# Patient Record
Sex: Female | Born: 1945 | Race: White | Hispanic: No
Health system: Southern US, Community
[De-identification: ages and names within clinical notes are randomized; demographics above are authoritative.]

## PROBLEM LIST (undated history)

## (undated) DIAGNOSIS — G629 Polyneuropathy, unspecified: Secondary | ICD-10-CM

## (undated) DIAGNOSIS — M810 Age-related osteoporosis without current pathological fracture: Secondary | ICD-10-CM

## (undated) DIAGNOSIS — E785 Hyperlipidemia, unspecified: Secondary | ICD-10-CM

## (undated) DIAGNOSIS — I219 Acute myocardial infarction, unspecified: Secondary | ICD-10-CM

## (undated) DIAGNOSIS — F329 Major depressive disorder, single episode, unspecified: Secondary | ICD-10-CM

## (undated) DIAGNOSIS — I10 Essential (primary) hypertension: Secondary | ICD-10-CM

## (undated) DIAGNOSIS — R627 Adult failure to thrive: Secondary | ICD-10-CM

## (undated) DIAGNOSIS — E875 Hyperkalemia: Secondary | ICD-10-CM

## (undated) DIAGNOSIS — N390 Urinary tract infection, site not specified: Secondary | ICD-10-CM

## (undated) DIAGNOSIS — F03918 Unspecified dementia, unspecified severity, with other behavioral disturbance: Secondary | ICD-10-CM

## (undated) DIAGNOSIS — I639 Cerebral infarction, unspecified: Secondary | ICD-10-CM

## (undated) DIAGNOSIS — I509 Heart failure, unspecified: Secondary | ICD-10-CM

## (undated) DIAGNOSIS — R269 Unspecified abnormalities of gait and mobility: Secondary | ICD-10-CM

## (undated) DIAGNOSIS — N189 Chronic kidney disease, unspecified: Secondary | ICD-10-CM

## (undated) DIAGNOSIS — E119 Type 2 diabetes mellitus without complications: Secondary | ICD-10-CM

## (undated) DIAGNOSIS — I2510 Atherosclerotic heart disease of native coronary artery without angina pectoris: Secondary | ICD-10-CM

## (undated) DIAGNOSIS — K219 Gastro-esophageal reflux disease without esophagitis: Secondary | ICD-10-CM

## (undated) DIAGNOSIS — F0390 Unspecified dementia without behavioral disturbance: Secondary | ICD-10-CM

## (undated) DIAGNOSIS — F32A Depression, unspecified: Secondary | ICD-10-CM

## (undated) DIAGNOSIS — F0391 Unspecified dementia with behavioral disturbance: Secondary | ICD-10-CM

## (undated) HISTORY — DX: Atherosclerotic heart disease of native coronary artery without angina pectoris: I25.10

## (undated) HISTORY — DX: Unspecified dementia, unspecified severity, with other behavioral disturbance: F03.918

## (undated) HISTORY — DX: Age-related osteoporosis without current pathological fracture: M81.0

## (undated) HISTORY — DX: Hyperkalemia: E87.5

## (undated) HISTORY — DX: Cerebral infarction, unspecified: I63.9

## (undated) HISTORY — DX: Type 2 diabetes mellitus without complications: E11.9

## (undated) HISTORY — DX: Chronic kidney disease, unspecified: N18.9

## (undated) HISTORY — DX: Essential (primary) hypertension: I10

## (undated) HISTORY — PX: CORONARY ARTERY BYPASS GRAFT: SHX141

## (undated) HISTORY — DX: Hyperlipidemia, unspecified: E78.5

## (undated) HISTORY — DX: Unspecified abnormalities of gait and mobility: R26.9

## (undated) HISTORY — DX: Gastro-esophageal reflux disease without esophagitis: K21.9

## (undated) HISTORY — DX: Polyneuropathy, unspecified: G62.9

## (undated) HISTORY — DX: Major depressive disorder, single episode, unspecified: F32.9

## (undated) HISTORY — DX: Depression, unspecified: F32.A

## (undated) HISTORY — DX: Unspecified dementia, unspecified severity, without behavioral disturbance, psychotic disturbance, mood disturbance, and anxiety: F03.90

## (undated) HISTORY — DX: Unspecified dementia with behavioral disturbance: F03.91

## (undated) HISTORY — DX: Urinary tract infection, site not specified: N39.0

## (undated) HISTORY — PX: TONSILLECTOMY: SUR1361

## (undated) HISTORY — DX: Adult failure to thrive: R62.7

## (undated) HISTORY — DX: Heart failure, unspecified: I50.9

## (undated) HISTORY — DX: Acute myocardial infarction, unspecified: I21.9

---

## 2003-09-20 ENCOUNTER — Other Ambulatory Visit: Payer: Self-pay

## 2004-04-29 ENCOUNTER — Emergency Department: Payer: Self-pay | Admitting: Emergency Medicine

## 2004-09-01 ENCOUNTER — Ambulatory Visit: Payer: Self-pay

## 2004-09-17 ENCOUNTER — Ambulatory Visit: Payer: Self-pay

## 2005-05-19 ENCOUNTER — Ambulatory Visit: Payer: Self-pay | Admitting: Family Medicine

## 2005-07-07 ENCOUNTER — Emergency Department: Payer: Self-pay | Admitting: Emergency Medicine

## 2005-07-08 ENCOUNTER — Emergency Department: Payer: Self-pay | Admitting: Emergency Medicine

## 2005-10-12 ENCOUNTER — Ambulatory Visit: Payer: Self-pay | Admitting: Family Medicine

## 2006-02-24 ENCOUNTER — Ambulatory Visit: Payer: Self-pay | Admitting: Gastroenterology

## 2006-03-23 ENCOUNTER — Ambulatory Visit: Payer: Self-pay | Admitting: Gastroenterology

## 2006-05-11 ENCOUNTER — Other Ambulatory Visit: Payer: Self-pay

## 2006-05-11 ENCOUNTER — Emergency Department: Payer: Self-pay

## 2006-11-29 ENCOUNTER — Ambulatory Visit: Payer: Self-pay | Admitting: Family Medicine

## 2007-01-09 ENCOUNTER — Ambulatory Visit: Payer: Self-pay | Admitting: Nephrology

## 2007-02-27 ENCOUNTER — Emergency Department: Payer: Self-pay | Admitting: Emergency Medicine

## 2007-07-17 ENCOUNTER — Ambulatory Visit: Payer: Self-pay | Admitting: Family Medicine

## 2007-08-08 ENCOUNTER — Emergency Department: Payer: Self-pay | Admitting: Emergency Medicine

## 2007-08-28 ENCOUNTER — Ambulatory Visit: Payer: Self-pay | Admitting: Internal Medicine

## 2007-09-01 ENCOUNTER — Inpatient Hospital Stay: Payer: Self-pay | Admitting: Internal Medicine

## 2007-09-02 ENCOUNTER — Other Ambulatory Visit: Payer: Self-pay

## 2007-09-03 ENCOUNTER — Other Ambulatory Visit: Payer: Self-pay

## 2007-09-04 ENCOUNTER — Ambulatory Visit: Payer: Self-pay | Admitting: Internal Medicine

## 2007-09-27 ENCOUNTER — Ambulatory Visit: Payer: Self-pay | Admitting: Internal Medicine

## 2007-10-28 ENCOUNTER — Ambulatory Visit: Payer: Self-pay | Admitting: Internal Medicine

## 2007-11-28 ENCOUNTER — Ambulatory Visit: Payer: Self-pay | Admitting: Internal Medicine

## 2008-01-08 ENCOUNTER — Ambulatory Visit: Payer: Self-pay | Admitting: Internal Medicine

## 2008-01-17 ENCOUNTER — Inpatient Hospital Stay: Payer: Self-pay | Admitting: Internal Medicine

## 2008-01-28 ENCOUNTER — Ambulatory Visit: Payer: Self-pay | Admitting: Internal Medicine

## 2008-02-27 ENCOUNTER — Ambulatory Visit: Payer: Self-pay | Admitting: Internal Medicine

## 2008-05-06 ENCOUNTER — Ambulatory Visit: Payer: Self-pay | Admitting: Internal Medicine

## 2008-10-07 ENCOUNTER — Inpatient Hospital Stay: Payer: Self-pay | Admitting: Internal Medicine

## 2008-12-11 ENCOUNTER — Ambulatory Visit: Payer: Self-pay | Admitting: Family Medicine

## 2009-07-09 ENCOUNTER — Emergency Department: Payer: Self-pay | Admitting: Emergency Medicine

## 2009-08-15 ENCOUNTER — Emergency Department: Payer: Self-pay | Admitting: Internal Medicine

## 2009-09-24 ENCOUNTER — Inpatient Hospital Stay: Payer: Self-pay | Admitting: Internal Medicine

## 2009-10-08 ENCOUNTER — Ambulatory Visit: Payer: Self-pay | Admitting: Ophthalmology

## 2009-10-11 ENCOUNTER — Inpatient Hospital Stay: Payer: Self-pay | Admitting: Internal Medicine

## 2009-11-14 ENCOUNTER — Emergency Department: Payer: Self-pay | Admitting: Emergency Medicine

## 2009-12-16 ENCOUNTER — Inpatient Hospital Stay: Payer: Self-pay | Admitting: *Deleted

## 2010-03-12 ENCOUNTER — Ambulatory Visit: Payer: Self-pay | Admitting: Ophthalmology

## 2010-03-16 ENCOUNTER — Emergency Department: Payer: Self-pay | Admitting: Emergency Medicine

## 2010-06-07 ENCOUNTER — Emergency Department: Payer: Self-pay | Admitting: Emergency Medicine

## 2010-09-22 ENCOUNTER — Emergency Department: Payer: Self-pay | Admitting: Emergency Medicine

## 2011-02-16 ENCOUNTER — Inpatient Hospital Stay: Payer: Self-pay | Admitting: Internal Medicine

## 2011-02-24 ENCOUNTER — Emergency Department: Payer: Self-pay

## 2011-03-20 ENCOUNTER — Emergency Department: Payer: Self-pay | Admitting: *Deleted

## 2011-03-23 ENCOUNTER — Emergency Department: Payer: Self-pay | Admitting: Emergency Medicine

## 2011-03-29 ENCOUNTER — Ambulatory Visit: Payer: Self-pay | Admitting: Ophthalmology

## 2011-08-09 ENCOUNTER — Emergency Department: Payer: Self-pay | Admitting: Emergency Medicine

## 2011-08-09 LAB — COMPREHENSIVE METABOLIC PANEL
Albumin: 2.9 g/dL — ABNORMAL LOW (ref 3.4–5.0)
Anion Gap: 9 (ref 7–16)
Bilirubin,Total: 0.2 mg/dL (ref 0.2–1.0)
Chloride: 104 mmol/L (ref 98–107)
Creatinine: 1.22 mg/dL (ref 0.60–1.30)
EGFR (African American): 53 — ABNORMAL LOW
EGFR (Non-African Amer.): 46 — ABNORMAL LOW
Glucose: 470 mg/dL — ABNORMAL HIGH (ref 65–99)
Osmolality: 298 (ref 275–301)
SGOT(AST): 14 U/L — ABNORMAL LOW (ref 15–37)
SGPT (ALT): 15 U/L
Sodium: 138 mmol/L (ref 136–145)
Total Protein: 6.7 g/dL (ref 6.4–8.2)

## 2011-08-09 LAB — URINALYSIS, COMPLETE
Glucose,UR: >=500 mg/dL (ref 0–75)
Protein: 100
RBC,UR: 1 /HPF (ref 0–5)
Squamous Epithelial: 1
WBC UR: 10 /HPF (ref 0–5)

## 2011-08-09 LAB — CBC
HCT: 39.8 % (ref 35.0–47.0)
MCH: 30.1 pg (ref 26.0–34.0)
MCHC: 33.4 g/dL (ref 32.0–36.0)

## 2011-08-09 LAB — TROPONIN I: Troponin-I: <0.02 ng/mL

## 2011-08-12 LAB — URINE CULTURE

## 2011-09-17 ENCOUNTER — Emergency Department: Payer: Self-pay | Admitting: *Deleted

## 2011-12-14 IMAGING — CT CT MAXILLOFACIAL WITHOUT CONTRAST
2 series · 16 of 40 positions shown, 20 images · non-contrast
Comparison: none

REASON FOR EXAM: facial trauma  pain
COMMENTS:

[Series 2: facial 3.0 h60f · axial · 0.34mm/px · z∈[-360,-225]mm · 13 of 53 slices shown, 17 images]
[im 4/53  brain]
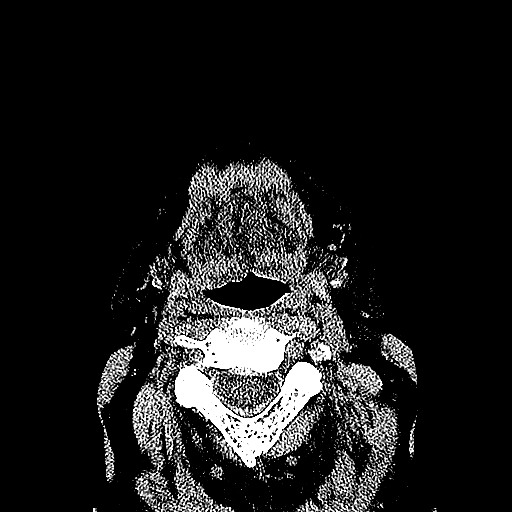
[im 4/53  bone]
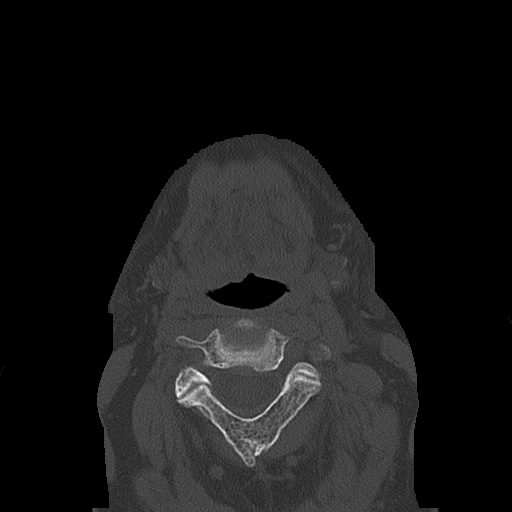
[im 8/53  bone]
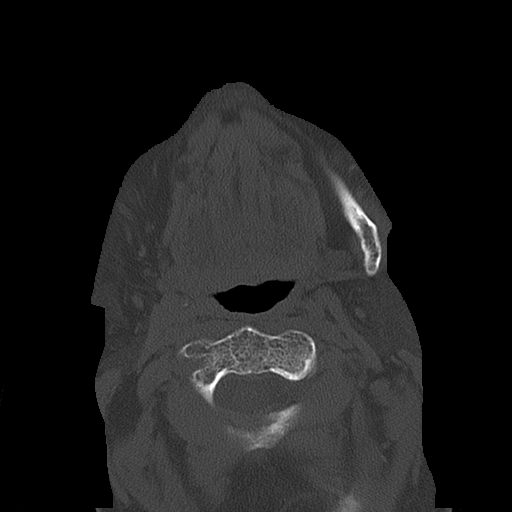
[im 11/53  bone]
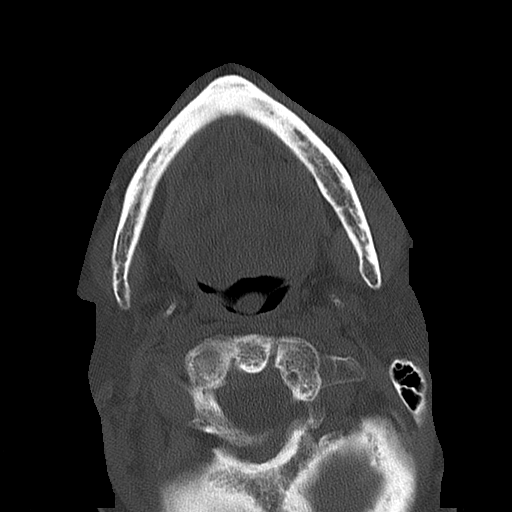
[im 15/53  bone]
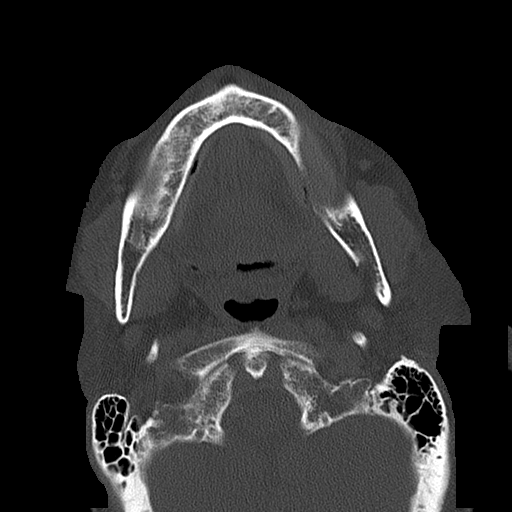
[im 18/53  brain]
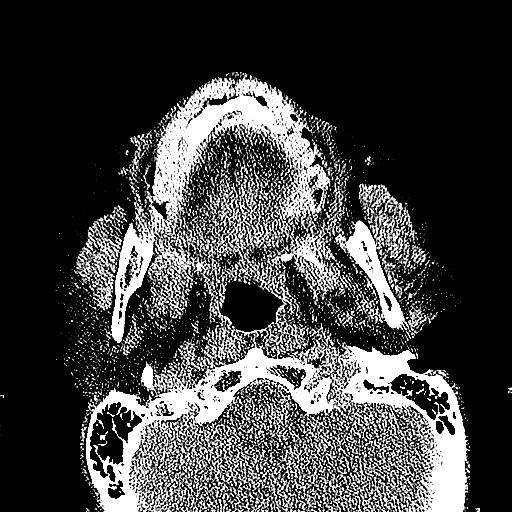
[im 18/53  bone]
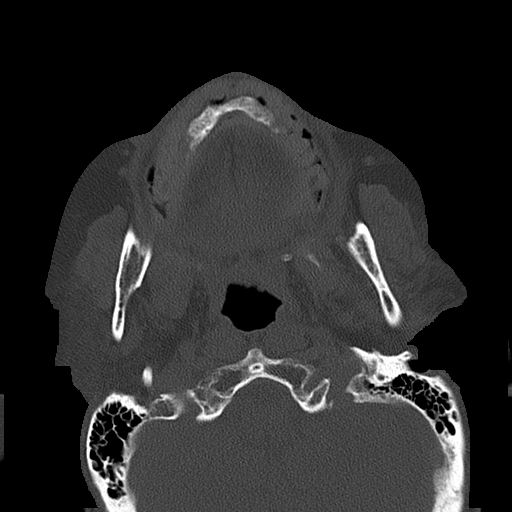
[im 22/53  bone]
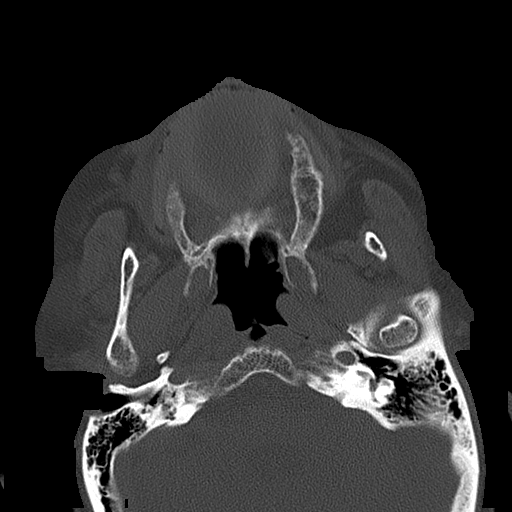
[im 27/53  bone]
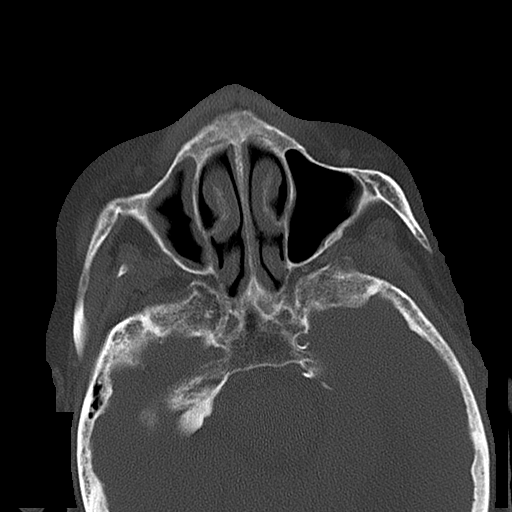
[im 31/53  bone]
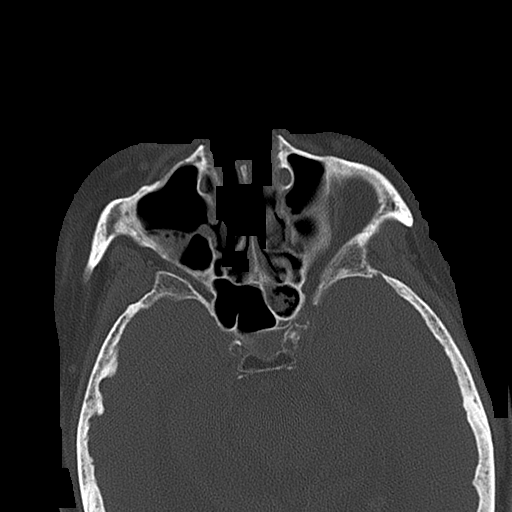
[im 35/53  brain]
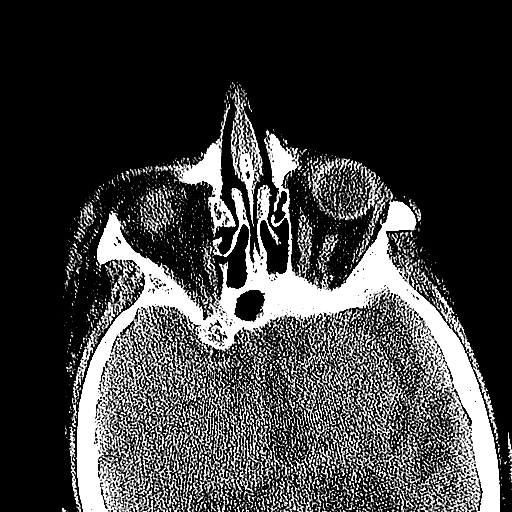
[im 35/53  bone]
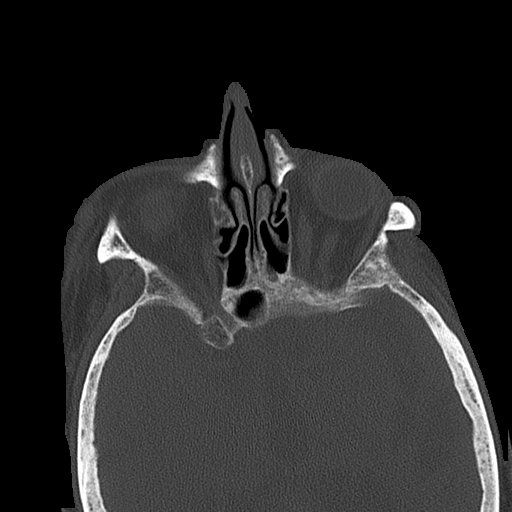
[im 38/53  bone]
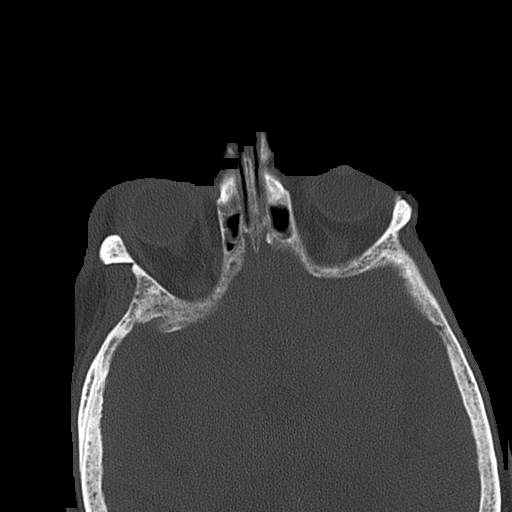
[im 42/53  bone]
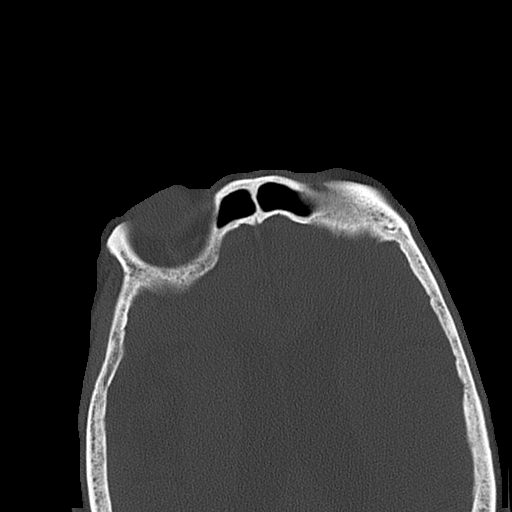
[im 45/53  bone]
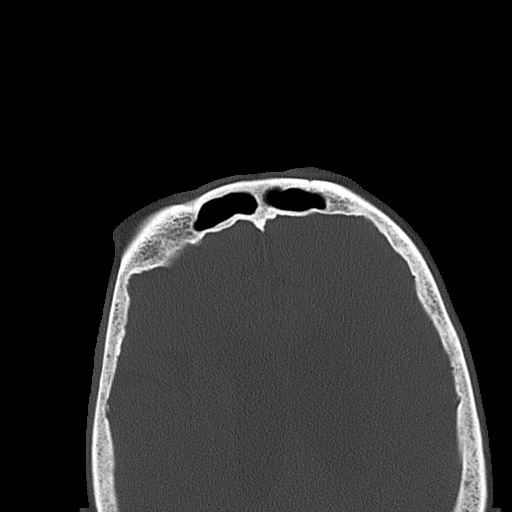
[im 49/53  brain]
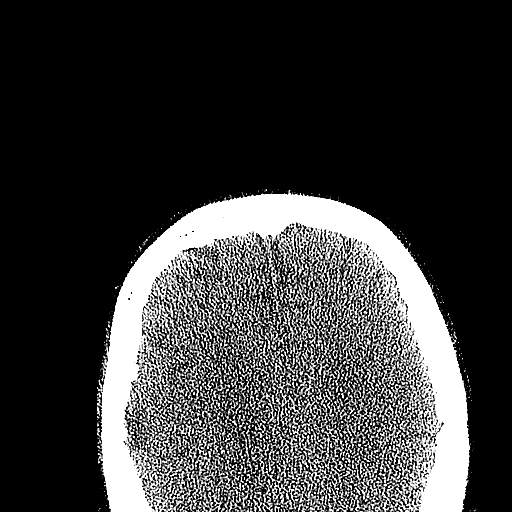
[im 49/53  bone]
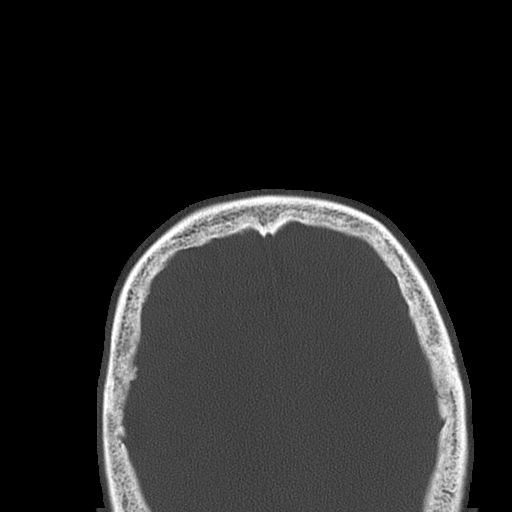

[Series 3: facial 3.0 spo · coronal · 0.32mm/px · 3 of 51 slices shown]
[im 17/51  bone]
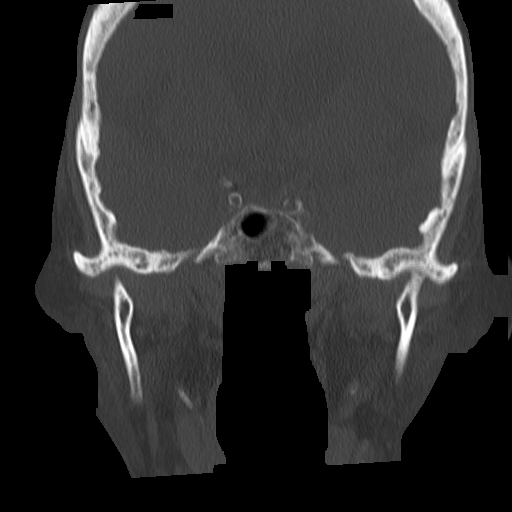
[im 23/51  bone]
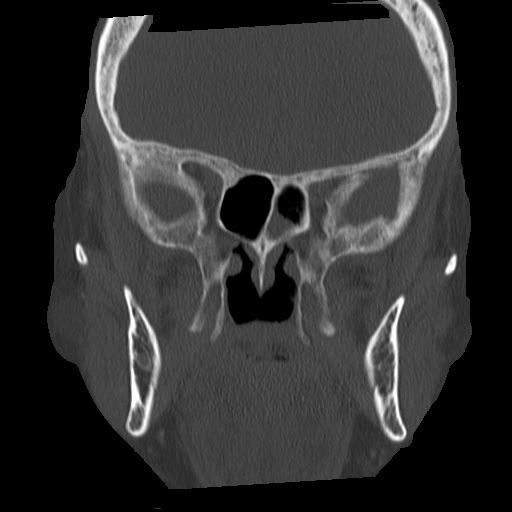
[im 28/51  bone]
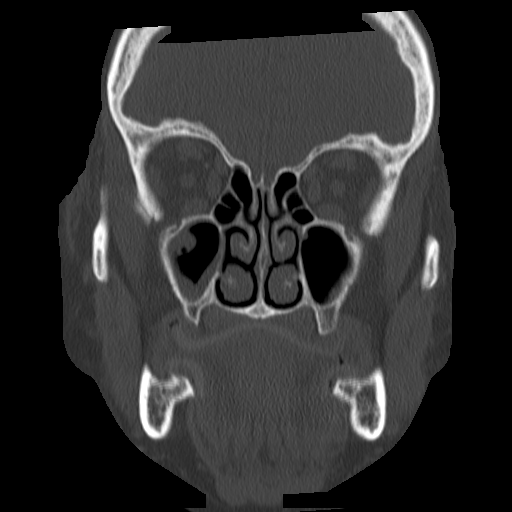

[16 of 40 positions shown; findings below may reference images not displayed]

PROCEDURE:     CT  - CT MAXILLOFACIAL AREA WO  - June 07, 2010  [DATE]

RESULT:     Axial imaging was performed through the facial bones using a
bone algorithm. Images were reconstructed in the axial plane at 3 mm
intervals and slice thicknesses. Coronal reconstructions were obtained as
well.

There is mucoperiosteal thickening in the sphenoid as well as both maxillary
sinuses. The frontal and ethmoid sinuses are clear. The nasal septum is
intact. The nasal bones also appear intact. The zygomatic arches exhibit no
evidence of acute fracture. The coronal images reveal the bony orbits to be
intact. There is mild age-related narrowing of the temporomandibular joints.
IMPRESSION: 1. I do not see evidence of an acute facial bone fracture.
2. There is a small amount of mucoperiosteal thickening within the maxillary
and sphenoid sinuses.

## 2011-12-20 ENCOUNTER — Inpatient Hospital Stay: Payer: Self-pay | Admitting: Internal Medicine

## 2011-12-20 LAB — URINALYSIS, COMPLETE
Bilirubin,UR: NEGATIVE
Glucose,UR: >=500 mg/dL (ref 0–75)
Nitrite: NEGATIVE
RBC,UR: 17 /HPF (ref 0–5)
WBC UR: 881 /HPF (ref 0–5)

## 2011-12-20 LAB — COMPREHENSIVE METABOLIC PANEL
Albumin: 3.5 g/dL (ref 3.4–5.0)
Alkaline Phosphatase: 126 U/L (ref 50–136)
BUN: 30 mg/dL — ABNORMAL HIGH (ref 7–18)
Bilirubin,Total: 0.7 mg/dL (ref 0.2–1.0)
Chloride: 97 mmol/L — ABNORMAL LOW (ref 98–107)
Creatinine: 1.33 mg/dL — ABNORMAL HIGH (ref 0.60–1.30)
EGFR (African American): 47 — ABNORMAL LOW
EGFR (Non-African Amer.): 41 — ABNORMAL LOW
Glucose: 537 mg/dL (ref 65–99)
Osmolality: 299 (ref 275–301)
Potassium: 5.3 mmol/L — ABNORMAL HIGH (ref 3.5–5.1)
SGOT(AST): 22 U/L (ref 15–37)
Sodium: 134 mmol/L — ABNORMAL LOW (ref 136–145)
Total Protein: 7.7 g/dL (ref 6.4–8.2)

## 2011-12-20 LAB — CBC WITH DIFFERENTIAL/PLATELET
Basophil %: 0.4 %
Eosinophil %: 0 %
HCT: 46.9 % (ref 35.0–47.0)
HGB: 16.4 g/dL — ABNORMAL HIGH (ref 12.0–16.0)
Lymphocyte #: 0.6 10*3/uL — ABNORMAL LOW (ref 1.0–3.6)
Lymphocyte %: 3.7 %
MCH: 30.6 pg (ref 26.0–34.0)
MCHC: 34.9 g/dL (ref 32.0–36.0)
MCV: 88 fL (ref 80–100)
Monocyte #: 0.8 x10 3/mm (ref 0.2–0.9)
Monocyte %: 4.7 %
Neutrophil #: 15.4 10*3/uL — ABNORMAL HIGH (ref 1.4–6.5)
Neutrophil %: 91.2 %

## 2011-12-20 LAB — MAGNESIUM: Magnesium: 2 mg/dL

## 2011-12-20 LAB — LIPASE, BLOOD: Lipase: 167 U/L (ref 73–393)

## 2011-12-21 LAB — CBC WITH DIFFERENTIAL/PLATELET
Basophil #: 0.2 10*3/uL — ABNORMAL HIGH (ref 0.0–0.1)
Eosinophil #: 0.1 10*3/uL (ref 0.0–0.7)
Eosinophil %: 0.5 %
Lymphocyte #: 1.7 10*3/uL (ref 1.0–3.6)
Lymphocyte %: 9.8 %
MCH: 29.1 pg (ref 26.0–34.0)
MCV: 86 fL (ref 80–100)
Monocyte #: 1.4 x10 3/mm — ABNORMAL HIGH (ref 0.2–0.9)
Platelet: 207 10*3/uL (ref 150–440)
WBC: 17.4 10*3/uL — ABNORMAL HIGH (ref 3.6–11.0)

## 2011-12-21 LAB — BASIC METABOLIC PANEL
Anion Gap: 9 (ref 7–16)
BUN: 29 mg/dL — ABNORMAL HIGH (ref 7–18)
Co2: 25 mmol/L (ref 21–32)
Creatinine: 1.09 mg/dL (ref 0.60–1.30)
EGFR (African American): >60
EGFR (Non-African Amer.): 52 — ABNORMAL LOW
Potassium: 4 mmol/L (ref 3.5–5.1)
Sodium: 139 mmol/L (ref 136–145)

## 2011-12-21 LAB — PROTIME-INR: Prothrombin Time: 13.1 secs (ref 11.5–14.7)

## 2011-12-21 LAB — HEMOGLOBIN A1C: Hemoglobin A1C: 11.2 % — ABNORMAL HIGH (ref 4.2–6.3)

## 2011-12-22 LAB — URINE CULTURE

## 2011-12-22 LAB — WBC: WBC: 15.4 10*3/uL — ABNORMAL HIGH (ref 3.6–11.0)

## 2012-02-26 ENCOUNTER — Emergency Department: Payer: Self-pay | Admitting: Emergency Medicine

## 2012-02-26 LAB — CBC WITH DIFFERENTIAL/PLATELET
Basophil %: 0.6 %
Eosinophil %: 1.6 %
HCT: 36.9 % (ref 35.0–47.0)
HGB: 12.4 g/dL (ref 12.0–16.0)
Lymphocyte #: 1.4 10*3/uL (ref 1.0–3.6)
Lymphocyte %: 12.4 %
MCHC: 33.6 g/dL (ref 32.0–36.0)
Monocyte #: 1.1 x10 3/mm — ABNORMAL HIGH (ref 0.2–0.9)
Monocyte %: 9.7 %
Neutrophil #: 8.5 10*3/uL — ABNORMAL HIGH (ref 1.4–6.5)
RBC: 4.29 10*6/uL (ref 3.80–5.20)
WBC: 11.2 10*3/uL — ABNORMAL HIGH (ref 3.6–11.0)

## 2012-02-26 LAB — COMPREHENSIVE METABOLIC PANEL
Albumin: 3.3 g/dL — ABNORMAL LOW (ref 3.4–5.0)
Alkaline Phosphatase: 97 U/L (ref 50–136)
Anion Gap: 6 — ABNORMAL LOW (ref 7–16)
Calcium, Total: 8.7 mg/dL (ref 8.5–10.1)
Co2: 27 mmol/L (ref 21–32)
Creatinine: 1.28 mg/dL (ref 0.60–1.30)
Osmolality: 283 (ref 275–301)
Potassium: 4.5 mmol/L (ref 3.5–5.1)
SGOT(AST): 25 U/L (ref 15–37)
Sodium: 139 mmol/L (ref 136–145)
Total Protein: 7.1 g/dL (ref 6.4–8.2)

## 2012-02-26 LAB — BASIC METABOLIC PANEL
Anion Gap: 8 (ref 7–16)
BUN: 26 mg/dL — ABNORMAL HIGH (ref 7–18)
Chloride: 109 mmol/L — ABNORMAL HIGH (ref 98–107)
Co2: 23 mmol/L (ref 21–32)
Creatinine: 1.2 mg/dL (ref 0.60–1.30)
EGFR (Non-African Amer.): 46 — ABNORMAL LOW
Potassium: 3.8 mmol/L (ref 3.5–5.1)
Sodium: 140 mmol/L (ref 136–145)

## 2012-08-05 ENCOUNTER — Inpatient Hospital Stay: Payer: Self-pay | Admitting: Internal Medicine

## 2012-08-05 LAB — COMPREHENSIVE METABOLIC PANEL
Anion Gap: 4 — ABNORMAL LOW (ref 7–16)
Calcium, Total: 8.5 mg/dL (ref 8.5–10.1)
Chloride: 110 mmol/L — ABNORMAL HIGH (ref 98–107)
Creatinine: 1.25 mg/dL (ref 0.60–1.30)
EGFR (African American): 51 — ABNORMAL LOW
Glucose: 125 mg/dL — ABNORMAL HIGH (ref 65–99)
Potassium: 5.9 mmol/L — ABNORMAL HIGH (ref 3.5–5.1)
SGPT (ALT): 24 U/L (ref 12–78)
Sodium: 140 mmol/L (ref 136–145)

## 2012-08-05 LAB — URINALYSIS, COMPLETE
Bacteria: NONE SEEN
Bilirubin,UR: NEGATIVE
Glucose,UR: NEGATIVE mg/dL (ref 0–75)
Nitrite: NEGATIVE
Ph: 6 (ref 4.5–8.0)
Protein: 100
RBC,UR: <1 /HPF (ref 0–5)
Specific Gravity: 1.008 (ref 1.003–1.030)

## 2012-08-05 LAB — CBC WITH DIFFERENTIAL/PLATELET
Basophil %: 0.8 %
Eosinophil #: 0.4 10*3/uL (ref 0.0–0.7)
Eosinophil %: 4.2 %
Lymphocyte #: 1.3 10*3/uL (ref 1.0–3.6)
Lymphocyte %: 13 %
MCH: 29.2 pg (ref 26.0–34.0)
MCHC: 34.3 g/dL (ref 32.0–36.0)
MCV: 85 fL (ref 80–100)
Monocyte %: 10.5 %
Neutrophil #: 6.9 10*3/uL — ABNORMAL HIGH (ref 1.4–6.5)
Neutrophil %: 71.5 %
Platelet: 152 10*3/uL (ref 150–440)
RBC: 4 10*6/uL (ref 3.80–5.20)
RDW: 14.4 % (ref 11.5–14.5)

## 2012-08-05 LAB — TROPONIN I: Troponin-I: <0.02 ng/mL

## 2012-08-05 LAB — PRO B NATRIURETIC PEPTIDE: B-Type Natriuretic Peptide: 1115 pg/mL — ABNORMAL HIGH (ref 0–125)

## 2012-08-06 DIAGNOSIS — I509 Heart failure, unspecified: Secondary | ICD-10-CM

## 2012-08-06 DIAGNOSIS — I059 Rheumatic mitral valve disease, unspecified: Secondary | ICD-10-CM

## 2012-08-06 LAB — BASIC METABOLIC PANEL
BUN: 21 mg/dL — ABNORMAL HIGH (ref 7–18)
Chloride: 108 mmol/L — ABNORMAL HIGH (ref 98–107)
Creatinine: 1.01 mg/dL (ref 0.60–1.30)
EGFR (African American): >60
EGFR (Non-African Amer.): 57 — ABNORMAL LOW
Glucose: 127 mg/dL — ABNORMAL HIGH (ref 65–99)
Osmolality: 284 (ref 275–301)
Potassium: 4.6 mmol/L (ref 3.5–5.1)
Sodium: 140 mmol/L (ref 136–145)

## 2012-08-06 LAB — LIPID PANEL
Ldl Cholesterol, Calc: 63 mg/dL (ref 0–100)
Triglycerides: 191 mg/dL (ref 0–200)
VLDL Cholesterol, Calc: 38 mg/dL (ref 5–40)

## 2012-08-06 LAB — CBC WITH DIFFERENTIAL/PLATELET
Basophil %: 1.1 %
Eosinophil %: 3.7 %
HGB: 12.5 g/dL (ref 12.0–16.0)
Lymphocyte #: 1.2 10*3/uL (ref 1.0–3.6)
MCHC: 34 g/dL (ref 32.0–36.0)
MCV: 84 fL (ref 80–100)
Monocyte #: 1 x10 3/mm — ABNORMAL HIGH (ref 0.2–0.9)
Neutrophil #: 6.8 10*3/uL — ABNORMAL HIGH (ref 1.4–6.5)
RBC: 4.37 10*6/uL (ref 3.80–5.20)
WBC: 9.3 10*3/uL (ref 3.6–11.0)

## 2012-08-06 LAB — TSH: Thyroid Stimulating Horm: 7.04 u[IU]/mL — ABNORMAL HIGH

## 2012-08-06 LAB — MAGNESIUM: Magnesium: 1.9 mg/dL

## 2012-08-06 LAB — HEMOGLOBIN A1C: Hemoglobin A1C: 6.8 % — ABNORMAL HIGH (ref 4.2–6.3)

## 2012-08-08 ENCOUNTER — Encounter: Payer: Self-pay | Admitting: *Deleted

## 2012-08-08 ENCOUNTER — Telehealth: Payer: Self-pay

## 2012-08-08 NOTE — Telephone Encounter (Signed)
tcm

## 2012-08-08 NOTE — Telephone Encounter (Signed)
Message copied by Marcelle Overlie on Tue Aug 08, 2012  9:10 AM ------      Message from: Kendrick Fries      Created: Tue Aug 08, 2012  9:05 AM       TCM      [facility]      Discharged: 08/08/12      Appoint: 08/15/12 [place]            Records printed. ------

## 2012-08-08 NOTE — Telephone Encounter (Signed)
TCM attempt #1 Non-working # for only # listed

## 2012-08-09 NOTE — Telephone Encounter (Signed)
TCM #2 attempt Non working #

## 2012-08-15 ENCOUNTER — Encounter: Payer: Self-pay | Admitting: Physician Assistant

## 2012-08-15 ENCOUNTER — Ambulatory Visit (INDEPENDENT_AMBULATORY_CARE_PROVIDER_SITE_OTHER): Payer: Medicare Other | Admitting: Physician Assistant

## 2012-08-15 VITALS — BP 154/67 | HR 57 | Ht 66.0 in | Wt 208.5 lb

## 2012-08-15 DIAGNOSIS — I10 Essential (primary) hypertension: Secondary | ICD-10-CM | POA: Insufficient documentation

## 2012-08-15 DIAGNOSIS — I2510 Atherosclerotic heart disease of native coronary artery without angina pectoris: Secondary | ICD-10-CM | POA: Insufficient documentation

## 2012-08-15 DIAGNOSIS — I5032 Chronic diastolic (congestive) heart failure: Secondary | ICD-10-CM | POA: Insufficient documentation

## 2012-08-15 DIAGNOSIS — R7989 Other specified abnormal findings of blood chemistry: Secondary | ICD-10-CM | POA: Insufficient documentation

## 2012-08-15 DIAGNOSIS — R42 Dizziness and giddiness: Secondary | ICD-10-CM

## 2012-08-15 DIAGNOSIS — I25810 Atherosclerosis of coronary artery bypass graft(s) without angina pectoris: Secondary | ICD-10-CM

## 2012-08-15 DIAGNOSIS — Z0189 Encounter for other specified special examinations: Secondary | ICD-10-CM

## 2012-08-15 DIAGNOSIS — I509 Heart failure, unspecified: Secondary | ICD-10-CM

## 2012-08-15 DIAGNOSIS — Z7689 Persons encountering health services in other specified circumstances: Secondary | ICD-10-CM

## 2012-08-15 MED ORDER — HYDRALAZINE HCL 25 MG PO TABS: 25.0000 mg | ORAL_TABLET | Freq: Two times a day (BID) | ORAL | Status: AC

## 2012-08-15 MED ORDER — FUROSEMIDE 20 MG PO TABS: ORAL_TABLET | ORAL | Status: AC

## 2012-08-15 NOTE — Assessment & Plan Note (Addendum)
Denies worsening CHF type symptoms. Euvolemic on exam today with clear lungs. Maintained on low-dose Lasix. Tolerating this dose well. Suspect recent admission was attributed to hypertensive emergency +/- dietary indiscretion. We discussed important CHF management techniques including daily weight and symptom monitoring, salt and fluid restriction, and taking Lasix on an empty stomach to facilitate absorption. Advised to take an additional Lasix PRN for weight gain or shortness of breath.

## 2012-08-15 NOTE — Patient Instructions (Addendum)
Please continue to take Lasix as prescribed. Take an additional tablet for weight increase of 3 lbs in one day, 5 lbs in two days, worsening shortness of breath or leg swelling.   Please monitor your weights daily.  Restrict salt intake to <2 gram/day. Limit fluid intake.   Please continue to monitor your blood pressure, and call for consistently elevated readings (> 150/90).   We will see you back in one month.   If your symptoms or level of activity worsen, please call the office to pursue a stress test.

## 2012-08-15 NOTE — Assessment & Plan Note (Addendum)
BP 154/67 today. Given her DM2, would like to start on ACEi/ARB however she has had hyperkalemia on these agents in the past. Will provide with hydralazine 25mg  PO BID to start. She will monitor her blood pressures and any new symptoms with this change. Stressed sodium restriction as a means of BP management as well. Continue BB, CCB.

## 2012-08-15 NOTE — Progress Notes (Signed)
Patient ID: Adrienne Lee, female   DOB: Jan 27, 1946, 67 y.o.   MRN: [id number]            Date:  08/15/2012   ID:  Adrienne Lee, DOB 05-Jul-1945, MRN [id number]  PCP:  Hyman Hopes, MD / Phineas Real, MD Primary Cardiologist:  Concha Se, MD   History of Present Illness: [name] is a 67 y.o. female with PMHx s/f mild dementia, questionable h/o CAD (s/p CABG at [facility]), chronic diastolic CHF, type 2 DM (insulin-dependent), HTN, GERD, CVA/TIA, peripheral neuropathy and dementia who was admitted to [facility] 5/10 to 08/07/12 for acute on chronic diastolic CHF.   She was admitted c/o worsening shortness of breath. BNP was mildly elevated at 1115. BP 212/64.CXR was without evidence of pulmonary edema. She was seen in consultation by Dr. Excell Seltzer after receiving IV Lasix. She was noted to be euvolemic at that time. She denied any chest pain and formally ruled out. TSH did return elevated at 7.04. A1C 6.8%. EKG revealed no evidence of ischemia. She diuresed further and symptoms improved. She was discharged on Lasix 20mg  daily and the medications listed below. She returns today for post-hospital follow-up. Intake and output and daily weights actually reflect minimal output and mild weight gain, respectively- question if these are accurate.   2D echo 08/06/12 revealed LVEF 55-60%, elevated LVEDP, mild concentric LVH, mild aortic valve sclerosis without stenosis, mild TR.   She returns today for follow-up. She is accompanied by a representative from her ALF. She has felt well since discharge denying chest pain, SOB/DOE, PND, orthopnea, palpitations, lightheadedness or syncope. She is on a moderated diet at the facility, however not a salt restricted diet. She denies increased fluid intake. No fevers, chills, n/v/d or active bleeding. She is ambulates with a walker at baseline and is able to complete her ADLs.   Her history is difficult to obtain due to mild dementia. She has a midline scar and what appears to be  endovascular vein harvest scar on LLE c/w prior CABG. When asking the patient, she denies having this done. There is a mention of prior CABG at [facility] from her recent [facility] admission. She denies any prior MI or chest pain. She does note undergoing prior stress tests, all "normal," but is unable to provide dates.   EKG: NSR, 57 bpm, RBBB, Q waves V4-V6, II (unchanged from 08/05/12 tracing)  Wt Readings from Last 3 Encounters:  08/15/12 208 lb 8 oz (94.575 kg)     Past Medical History  Diagnosis Date  . Hypertension   . Diabetes mellitus without complication   . Dementia   . Hyperlipidemia   . Osteoporosis   . Depression   . Peripheral neuropathy   . Chronic kidney disease   . CVA (cerebral infarction)   . Urinary tract infection     history  . GERD (gastroesophageal reflux disease)   . Coronary artery disease   . Hyperkalemia   . CHF (congestive heart failure)     Current Outpatient Prescriptions  Medication Sig Dispense Refill  . amLODipine (NORVASC) 10 MG tablet Take 10 mg by mouth daily.      Marland Kitchen aspirin 81 MG tablet Take 81 mg by mouth daily.      Marland Kitchen donepezil (ARICEPT) 10 MG tablet Take 10 mg by mouth daily.      . furosemide (LASIX) 20 MG tablet Take 20 mg by mouth daily.      . insulin aspart (NOVOLOG) 100 UNIT/ML injection Inject into  the skin as directed.      . insulin glargine (LANTUS) 100 UNIT/ML injection Inject 25 Units into the skin at bedtime.      . metFORMIN (GLUCOPHAGE) 500 MG tablet Take 500 mg by mouth 2 (two) times daily with a meal.      . metoprolol tartrate (LOPRESSOR) 25 MG tablet Take 25 mg by mouth daily.      . mirtazapine (REMERON) 15 MG tablet Take 0.5 mg by mouth at bedtime.      . pantoprazole (PROTONIX) 40 MG tablet Take 40 mg by mouth daily.      . rosuvastatin (CRESTOR) 10 MG tablet Take 10 mg by mouth daily.      . sertraline (ZOLOFT) 100 MG tablet Take 100 mg by mouth daily.       No current facility-administered medications for this visit.     Allergies:    Allergies  Allergen Reactions  . Ace Inhibitors   . Augmentin (Amoxicillin-Pot Clavulanate)   . Bactrim (Sulfamethoxazole W-Trimethoprim)   . Codeine   . Levaquin (Levofloxacin In D5w)   . Morphine And Related   . Penicillins   . Phenergan (Promethazine Hcl)   . Sulfa Antibiotics     Social History:  The patient  reports that she has quit smoking. She does not have any smokeless tobacco history on file. She reports that she does not drink alcohol or use illicit drugs.   ROS:  Please see the history of present illness. All other systems reviewed and negative.   PHYSICAL EXAM: VS:  BP 154/67  Pulse 57  Ht 5\' 6"  (1.676 m)  Wt 208 lb 8 oz (94.575 kg)  BMI 33.67 kg/m2 Well nourished, well developed elderly female seated in a wheelchair in no acute distress HEENT: normal Neck: no JVD Cardiac:  normal S1, S2; RRR; no murmur Lungs:  clear to auscultation bilaterally, no wheezing, rhonchi or rales Abd: soft, nontender, no hepatomegaly Ext: no edema Musculoskeletal: midline scar on chest noted, LLE linear scar just inferior to L knee noted Skin: warm and dry Neuro:  CNs 2-12 intact, no focal abnormalities noted

## 2012-08-15 NOTE — Assessment & Plan Note (Signed)
On recent hospital admission. This may have been in the setting of CHF decompensation, however have advised to follow-up with PCP for further evaluation.

## 2012-08-15 NOTE — Assessment & Plan Note (Addendum)
She ruled out for MI on recent [facility] admission. She denies any chest pain post-discharge. There seems to be some confusion from the patient about whether she has had prior bypass grafting or not. There is clearly evidence of this on physical exam. EKG does suggest prior infarct. Her mild dementia does cloud the details of her history. She believes this was performed at either [facility] or [facility]. Will plan to check for these records. Discussed option of stress testing to further evaluate. The patient declines this option. Unclear if she would derive significant benefit from revascularization if stress test were to return abnormal. She will call if she develops worsening CHF or new ischemic symptoms. Continue ASA, BB and statin for now.

## 2012-09-01 IMAGING — CT CT ABD-PELV W/O CM
1 of 2 series · 15 of 32 positions shown, 19 images · non-contrast
Comparison: none

REASON FOR EXAM: (1) severe acute onset R flank/RUQ pain (s/p
cholecystectomy yrs ago); (2) see a
COMMENTS:

PROCEDURE:     CT  - CT ABDOMEN AND PELVIS W[DATE]  [DATE]
RESULT:     CT abdomen and pelvis dated 02/24/2011.
TECHNIQUE: Helical noncontrasted 3 mm sections were obtained from the lung
bases through the pubic symphysis.

[Series 2: 3mm soft tissue · axial · 0.81mm/px · z∈[-1014,-574]mm · 15 of 161 slices shown, 19 images]
[im 7/161  soft-tissue]
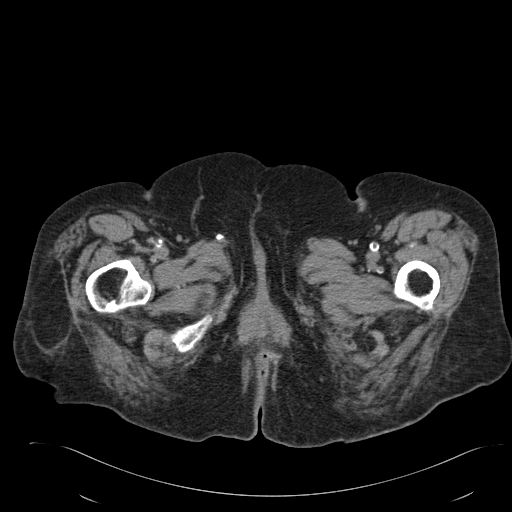
[im 7/161  bone]
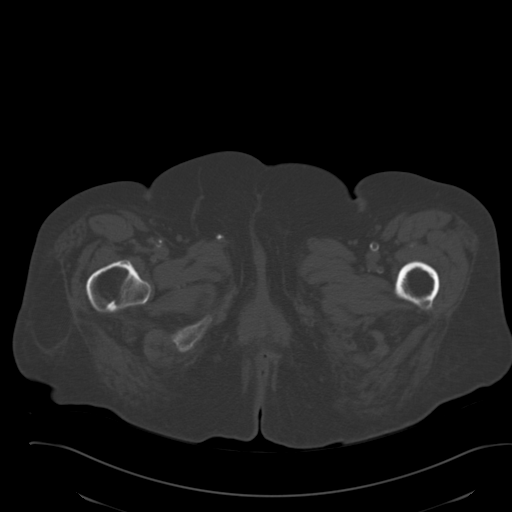
[im 21/161  soft-tissue]
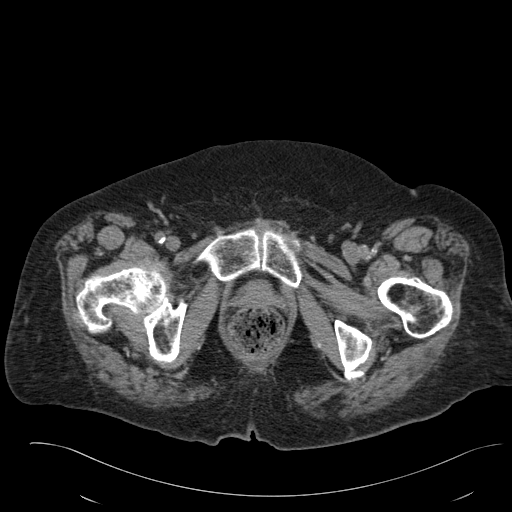
[im 35/161  soft-tissue]
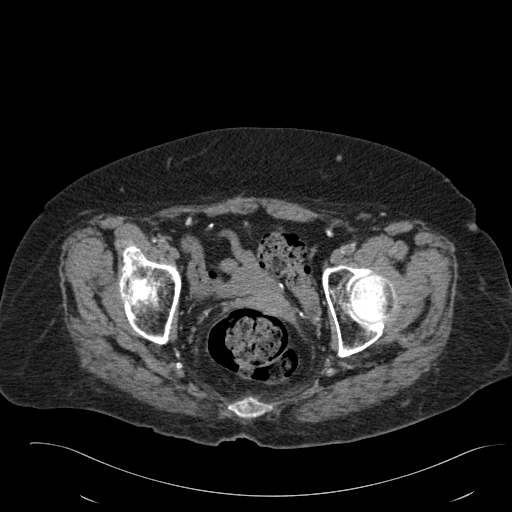
[im 42/161  soft-tissue]
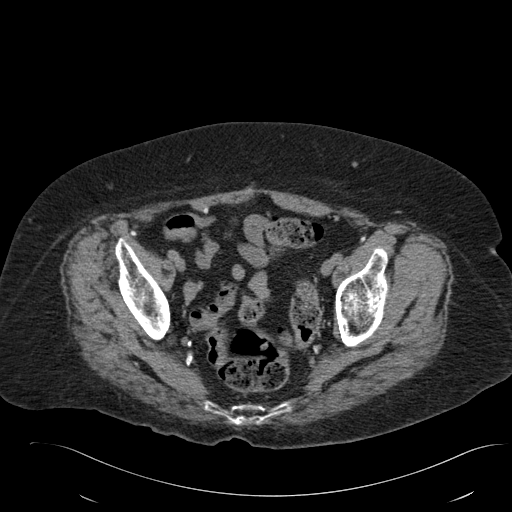
[im 56/161  soft-tissue]
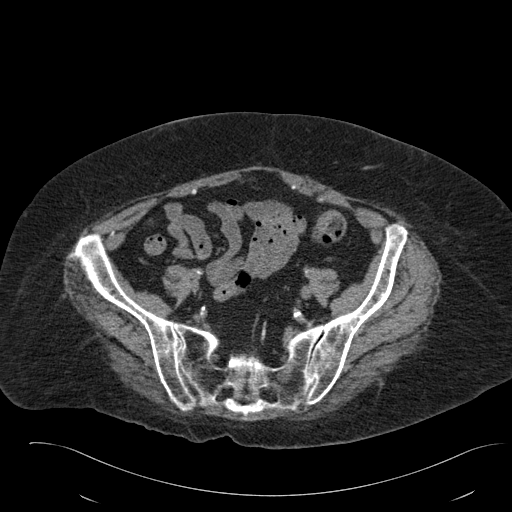
[im 70/161  soft-tissue]
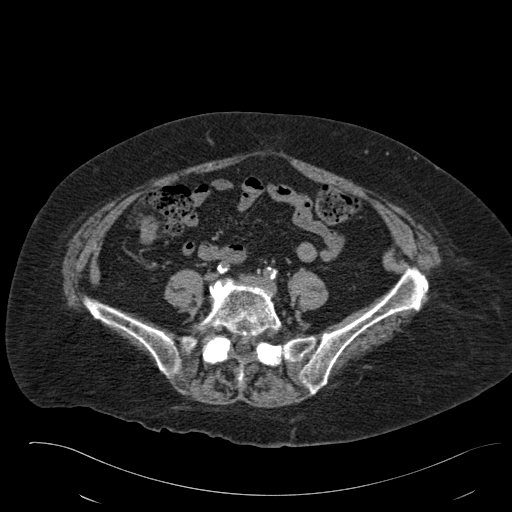
[im 84/161  soft-tissue]
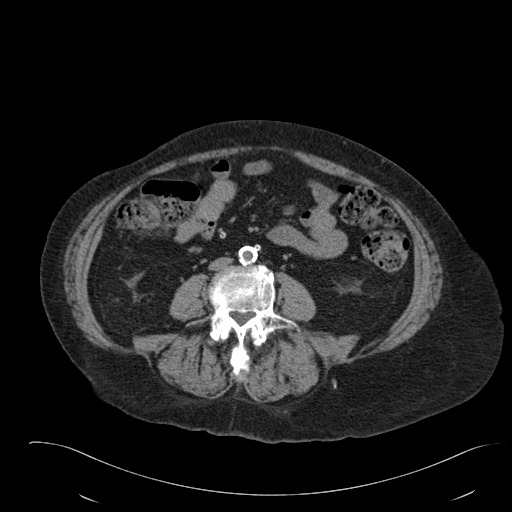
[im 91/161  soft-tissue]
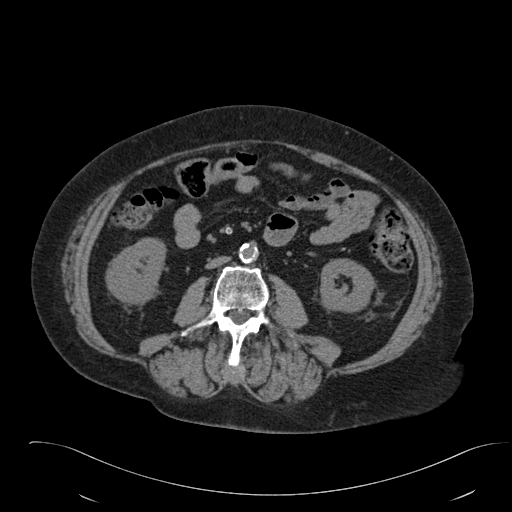
[im 105/161  soft-tissue]
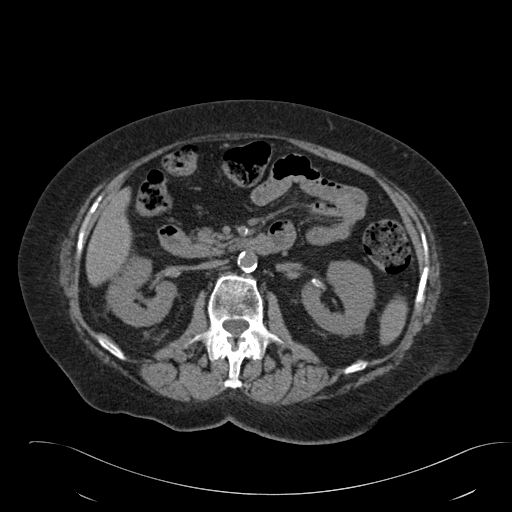
[im 105/161  bone]
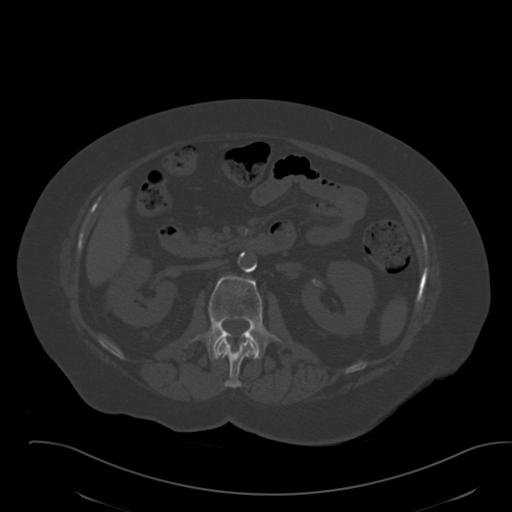
[im 119/161  soft-tissue]
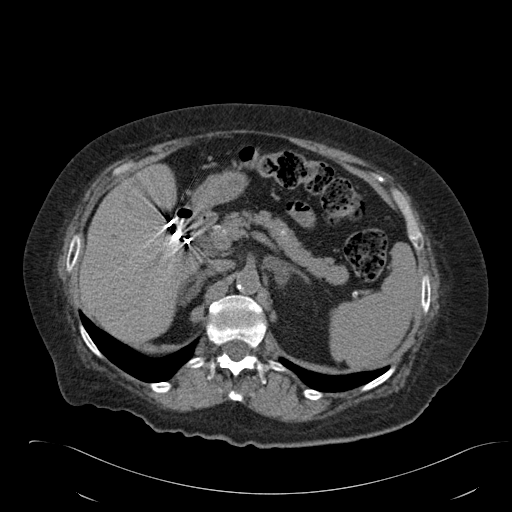
[im 126/161  soft-tissue]
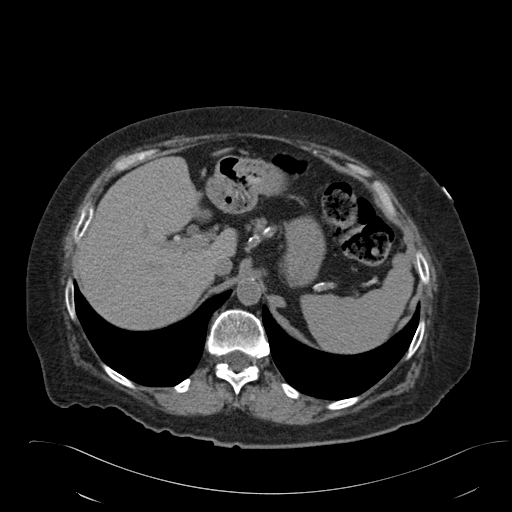
[im 133/161  lung]
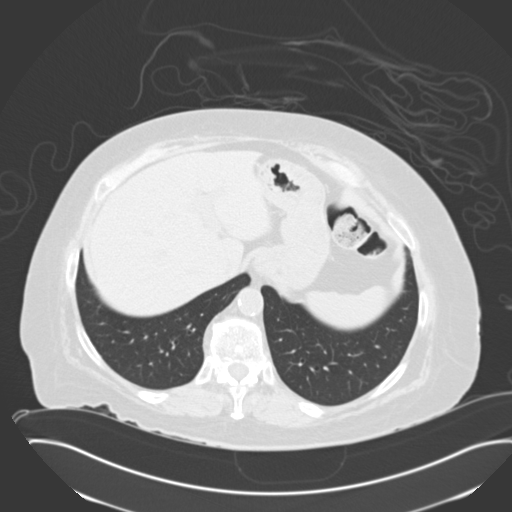
[im 140/161  soft-tissue]
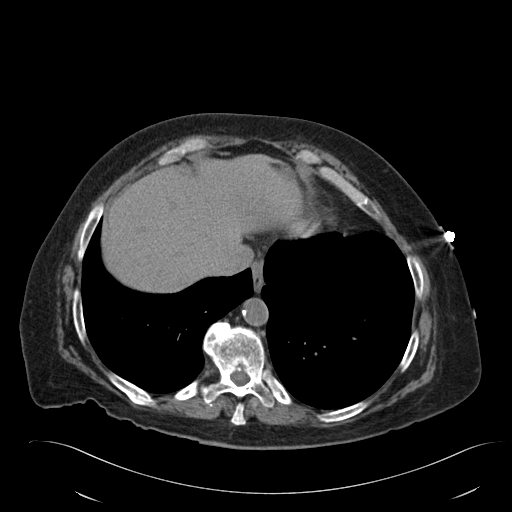
[im 140/161  lung]
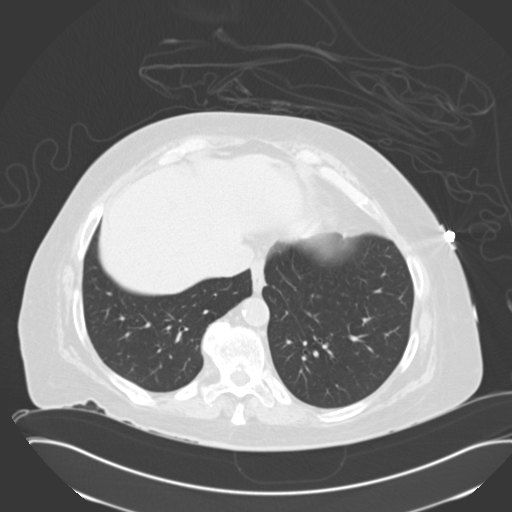
[im 147/161  lung]
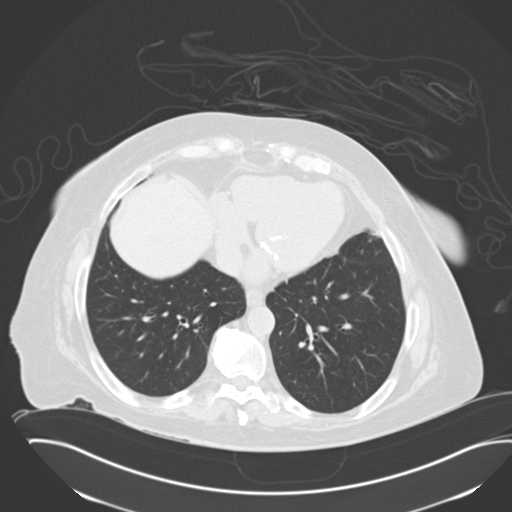
[im 154/161  soft-tissue]
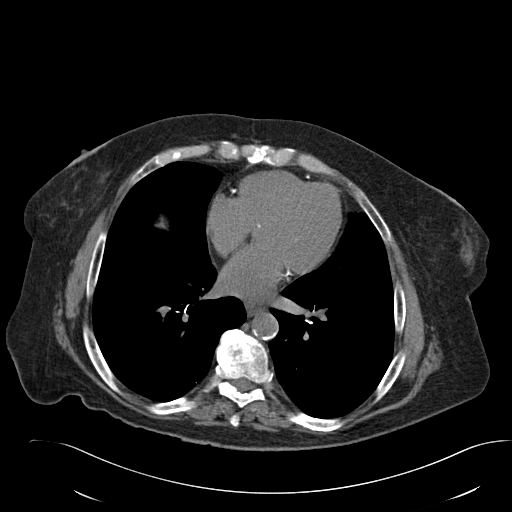
[im 154/161  lung]
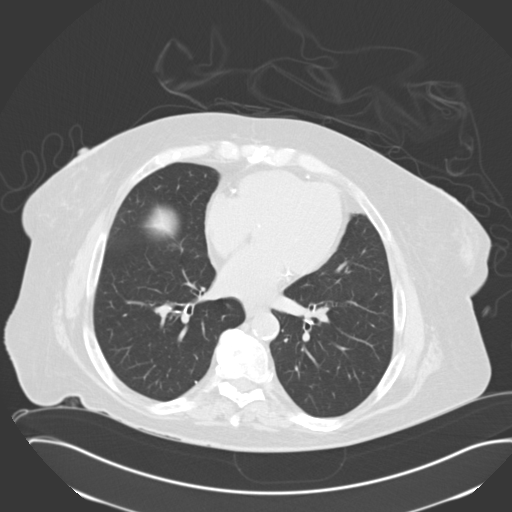

[15 of 32 positions shown; findings below may reference images not displayed]

FINDINGS: The lung bases are unremarkable.

Noncontrasted evaluation of the liver, spleen, right adrenal, pancreas is
unremarkable. Patient status post cholecystectomy. There is diffuse
enlargement of the left adrenal which demonstrates an adreniform shape.
Vascular calcifications are identified within the renal arteries of evidence
of nephrolithiasis, hydronephrosis, hydroureter, nor ureterolithiasis. There
is no CT evidence of bowel obstruction, enteritis, colitis, nor
diverticulitis. A moderate to large amount of stool is appreciated within
the colon. These findings are made within the limitations of a noncontrasted
CT. There is no evidence of abdominal aortic aneurysm, abdominal or pelvic;
free fluid, loculated fluid collections, masses nor adenopathy.
IMPRESSION: Within the limitations of a noncontrasted CT there is no
evidence of obstructive inflammatory abnormalities.
2. Moderate to large amount of stool within the colon.

## 2012-09-01 IMAGING — CR DG CHEST 2V
1 series · 2 of 2 positions shown · non-contrast
Comparison: none

REASON FOR EXAM: R lower chest pain
COMMENTS:

PROCEDURE:     DXR - DXR CHEST PA (OR AP) AND LATERAL  - February 24, 2011  [DATE]
RESULT:     Comparison: 02/16/2011 and 12/16/2009

[Series 2: w chest pa · 0.14mm/px · 2 of 2 slices shown]
[im 1/2]
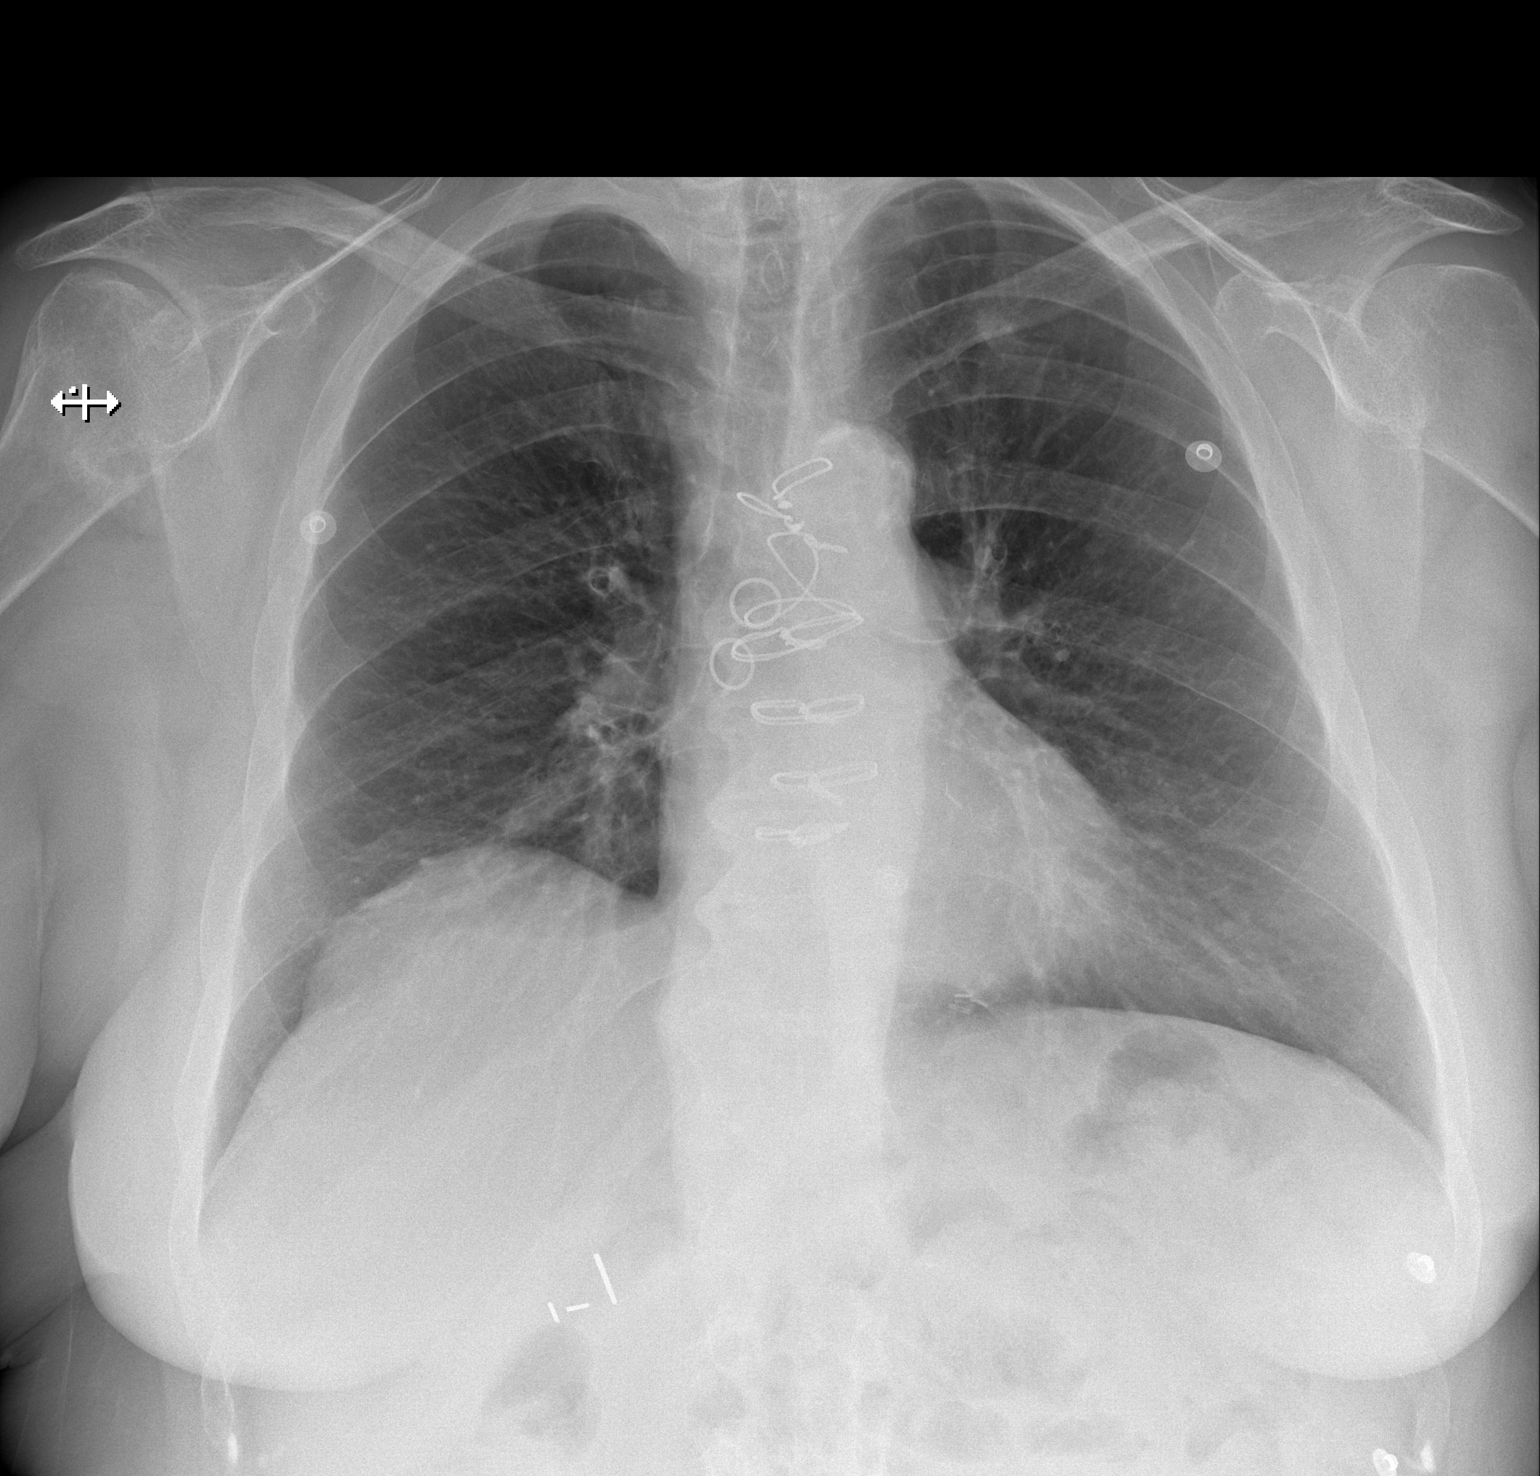
[im 2/2]
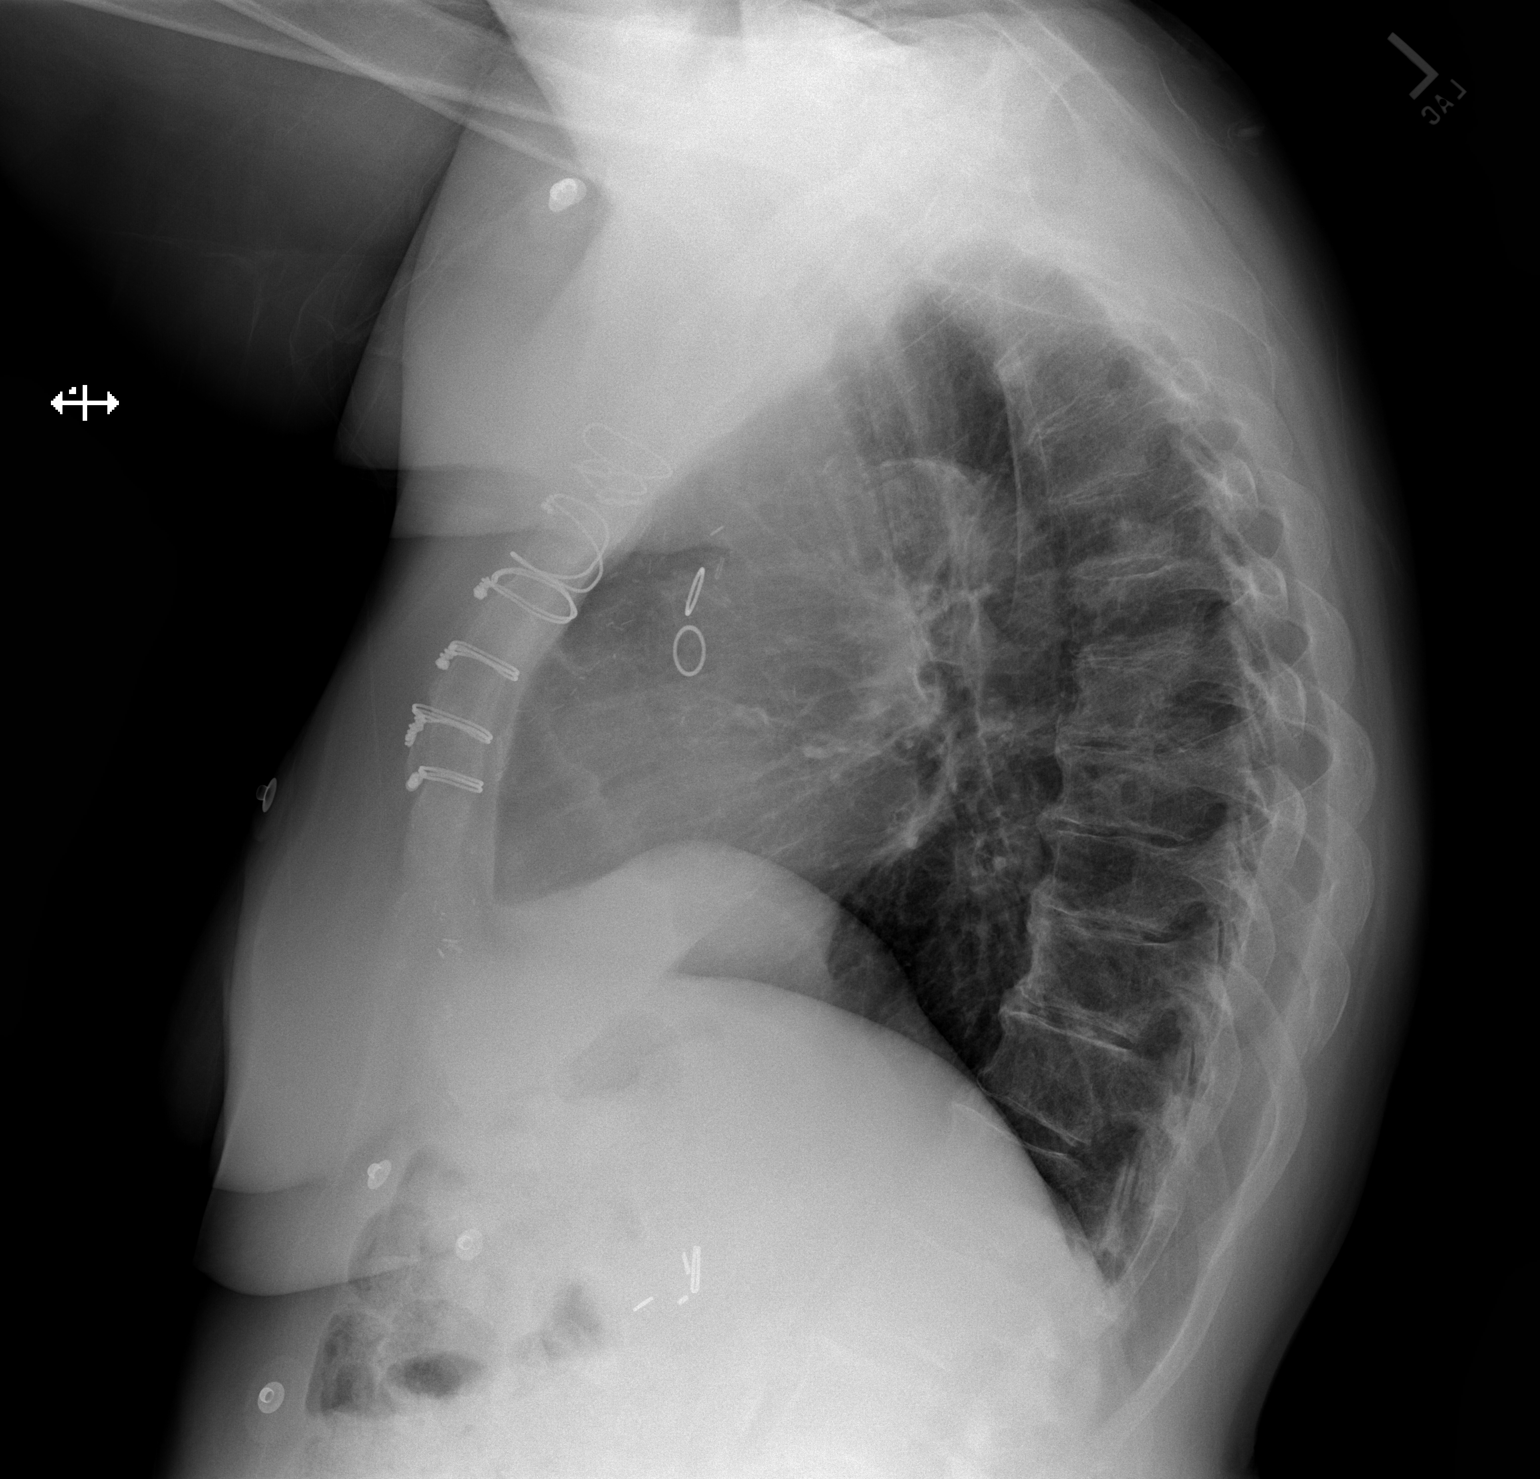

[2 of 2 positions shown; findings below may reference images not displayed]

FINDINGS: The heart and mediastinum are stable. Prior median sternotomy and CABG.
Small density overlying the left upper lung is similar to prior studies and
likely secondary to overlying first costochondral junction. The lungs are
clear.
IMPRESSION: No acute cardiopulmonary disease.

## 2012-09-20 ENCOUNTER — Ambulatory Visit: Payer: TRICARE For Life (TFL) | Admitting: Cardiovascular Disease

## 2013-02-26 ENCOUNTER — Emergency Department: Payer: Self-pay | Admitting: Emergency Medicine

## 2013-02-26 LAB — CBC WITH DIFFERENTIAL/PLATELET
Basophil #: 0.1 10*3/uL (ref 0.0–0.1)
Eosinophil %: 2.5 %
HCT: 35.8 % (ref 35.0–47.0)
HGB: 12 g/dL (ref 12.0–16.0)
MCH: 28.8 pg (ref 26.0–34.0)
MCHC: 33.4 g/dL (ref 32.0–36.0)
MCV: 86 fL (ref 80–100)
Monocyte #: 0.9 x10 3/mm (ref 0.2–0.9)
Monocyte %: 8.7 %
Platelet: 176 10*3/uL (ref 150–440)
WBC: 10.6 10*3/uL (ref 3.6–11.0)

## 2013-02-26 LAB — URINALYSIS, COMPLETE
Bilirubin,UR: NEGATIVE
Glucose,UR: NEGATIVE mg/dL (ref 0–75)
Hyaline Cast: 10
Ketone: NEGATIVE
Leukocyte Esterase: NEGATIVE
Squamous Epithelial: 3
WBC UR: 1 /HPF (ref 0–5)

## 2013-02-26 LAB — COMPREHENSIVE METABOLIC PANEL
Albumin: 3.1 g/dL — ABNORMAL LOW (ref 3.4–5.0)
Alkaline Phosphatase: 94 U/L
Bilirubin,Total: 0.3 mg/dL (ref 0.2–1.0)
Calcium, Total: 8.6 mg/dL (ref 8.5–10.1)
Co2: 28 mmol/L (ref 21–32)
EGFR (African American): 45 — ABNORMAL LOW
EGFR (Non-African Amer.): 39 — ABNORMAL LOW
Glucose: 94 mg/dL (ref 65–99)
Osmolality: 286 (ref 275–301)
Potassium: 4.7 mmol/L (ref 3.5–5.1)
SGPT (ALT): 14 U/L (ref 12–78)
Sodium: 140 mmol/L (ref 136–145)

## 2013-02-26 LAB — LIPASE, BLOOD: Lipase: 216 U/L (ref 73–393)

## 2013-02-26 LAB — TROPONIN I: Troponin-I: <0.02 ng/mL

## 2013-02-26 LAB — MAGNESIUM: Magnesium: 1.7 mg/dL — ABNORMAL LOW

## 2013-02-28 LAB — URINE CULTURE

## 2013-03-02 ENCOUNTER — Emergency Department: Payer: Self-pay | Admitting: Emergency Medicine

## 2013-03-02 LAB — CBC
HCT: 35 % (ref 35.0–47.0)
MCH: 28.8 pg (ref 26.0–34.0)
MCHC: 33.7 g/dL (ref 32.0–36.0)
MCV: 86 fL (ref 80–100)
Platelet: 181 10*3/uL (ref 150–440)
RBC: 4.08 10*6/uL (ref 3.80–5.20)
RDW: 13.6 % (ref 11.5–14.5)
WBC: 11.3 10*3/uL — ABNORMAL HIGH (ref 3.6–11.0)

## 2013-03-02 LAB — COMPREHENSIVE METABOLIC PANEL
Albumin: 3.4 g/dL (ref 3.4–5.0)
Anion Gap: 10 (ref 7–16)
BUN: 33 mg/dL — ABNORMAL HIGH (ref 7–18)
Bilirubin,Total: 0.3 mg/dL (ref 0.2–1.0)
Chloride: 106 mmol/L (ref 98–107)
EGFR (African American): 31 — ABNORMAL LOW
Glucose: 132 mg/dL — ABNORMAL HIGH (ref 65–99)

## 2013-03-02 LAB — URINALYSIS, COMPLETE
Bilirubin,UR: NEGATIVE
Ketone: NEGATIVE
Ph: 5 (ref 4.5–8.0)
Protein: 100
RBC,UR: 27 /HPF (ref 0–5)
Squamous Epithelial: 3

## 2013-03-02 LAB — TROPONIN I: Troponin-I: <0.02 ng/mL

## 2013-03-03 LAB — CULTURE, BLOOD (SINGLE)

## 2013-04-13 ENCOUNTER — Non-Acute Institutional Stay (SKILLED_NURSING_FACILITY): Payer: Medicare Other | Admitting: Internal Medicine

## 2013-04-13 DIAGNOSIS — F0281 Dementia in other diseases classified elsewhere with behavioral disturbance: Secondary | ICD-10-CM

## 2013-04-13 DIAGNOSIS — E1149 Type 2 diabetes mellitus with other diabetic neurological complication: Secondary | ICD-10-CM

## 2013-04-13 DIAGNOSIS — R269 Unspecified abnormalities of gait and mobility: Secondary | ICD-10-CM

## 2013-04-13 DIAGNOSIS — F02818 Dementia in other diseases classified elsewhere, unspecified severity, with other behavioral disturbance: Secondary | ICD-10-CM

## 2013-04-13 NOTE — Progress Notes (Signed)
Patient ID: Adrienne Lee, female   DOB: 07/11/1945, 67 y.o.   MRN: [id number]  Facility; [facility] Chief complaint; admission to SNF post admit to [facility] psychiatry from 12/9 to 1/16  History; this is a 67-year-old woman who has dementia and was referred from her long-term care facility because of your debility and angry behaviors. Another part of the history in her discharge summary states that she was at home therefore I don't really have the correct information at present. The patient states she was living at her home in [place] with her husband however apparently he is actually deceased. In [place] she was felt to have moderate to severe dementia she was so mostly combative with personal care such as bathing and toileting. She was changed to Lexapro and had Risperdal 1 mg twice a day added for psychosis. This was met with improvement.  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 On insulin #3 history of coronary artery disease status post CABG #4 history of stroke #5 hypertension  Medications; #1 Aricept 10 mg at bedtime #2 Norvasc 10 mg daily #3 Lexapro 10 mg every morning #4 hydralazine 25 twice a day #5 NovoLog insulin 4 units subcutaneously with breakfast lunch and dinner #6 Levemir 10 units subcutaneous daily #7 Toprolol 25 daily #8 Risperdal 1 mg twice a day #9 lorazepam 0.5 every 6 when necessary for anxiety or agitation  Extensive allergy list includes; ACE inhibitors, morphine, penicillin, Bactrim, Percocet, codeine, Phenergan, and quinolones  Social history; I have virtually no information on this patient other than the fact that she apparently has a DFS. guardian at [place] social services. As stated above at one point it says she was in a facility and then another point that she was at home. This will need to be clarified. She apparently has 2 children but they  are estranged from her  Family history; not available from any current  Source  Review of systems; not felt to be reliable at this point because of the patient's angry aggressive demeanor  Physical examination; Gen. somewhat easily angered, depressive elements Respiratory clear entry bilaterally Cardiac heart sounds are normal there is no murmurs or bruits she appears to be euvolemic Abdomen; no liver no spleen no tenderness no masses GU bladder is not distended there is no costovertebral angle tenderness Neurologic; I could not do a meaningful exam on this woman due to lack of cooperation. However she does not have obvious EPS. She is able to bring herself to a standing position however her balance is very poor gait is wide-based probably secondary to diabetic neuropathy Mental status; she is convinced she is living at home in [place] with her husband. She cannot state her exact age although was able to tell me that she is in her mid 60s. She does not remember which hospital she came from normal how long she has been here. 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 She is now on Risperdal and Lexapro. She apparently stabilized but I suspect there will be difficult issues ahead. This is presumably dementia of early onset although collateral history/family history is lacking #2 type 2 diabetes with diabetic neuropathy probable gait ataxia mostly related to this although there may be additional issues. We'll need to do a better examine the patient is more cooperative #3 history of coronary artery disease status post CABG this does not appear to   be unstable #4 hypertension we'll need to monitor this while she is here  I suspect this patient has continued difficult issues related to her mental status. There is still probably some degree of depression as well   

## 2013-06-01 LAB — CBC AND DIFFERENTIAL
HEMATOCRIT: 37 % (ref 36–46)
HEMOGLOBIN: 11.9 g/dL — AB (ref 12.0–16.0)
Platelets: 177 10*3/uL (ref 150–399)
WBC: 9.3 10^3/mL

## 2013-06-01 LAB — BASIC METABOLIC PANEL
BUN: 23 mg/dL — AB (ref 4–21)
Creatinine: 1.1 mg/dL (ref 0.5–1.1)
Glucose: 87 mg/dL
Potassium: 4.8 mmol/L (ref 3.4–5.3)
SODIUM: 141 mmol/L (ref 137–147)

## 2013-06-01 LAB — LIPID PANEL
Cholesterol: 141 mg/dL (ref 0–200)
LDL Cholesterol: 87 mg/dL
Triglycerides: 115 mg/dL (ref 40–160)

## 2013-06-01 LAB — HEPATIC FUNCTION PANEL
ALK PHOS: 74 U/L (ref 25–125)
ALT: 5 U/L — AB (ref 7–35)
AST: 9 U/L — AB (ref 13–35)

## 2013-06-01 LAB — TSH: TSH: 3.22 u[IU]/mL (ref 0.41–5.90)

## 2013-06-01 LAB — HEMOGLOBIN A1C: Hgb A1c MFr Bld: 6.8 % — AB (ref 4.0–6.0)

## 2013-06-26 ENCOUNTER — Non-Acute Institutional Stay (SKILLED_NURSING_FACILITY): Payer: Medicare Other | Admitting: Adult Health

## 2013-06-26 DIAGNOSIS — F03918 Unspecified dementia, unspecified severity, with other behavioral disturbance: Secondary | ICD-10-CM

## 2013-06-26 DIAGNOSIS — E1149 Type 2 diabetes mellitus with other diabetic neurological complication: Secondary | ICD-10-CM

## 2013-06-26 DIAGNOSIS — F29 Unspecified psychosis not due to a substance or known physiological condition: Secondary | ICD-10-CM

## 2013-06-26 DIAGNOSIS — I10 Essential (primary) hypertension: Secondary | ICD-10-CM

## 2013-06-26 DIAGNOSIS — F341 Dysthymic disorder: Secondary | ICD-10-CM

## 2013-06-26 DIAGNOSIS — F0391 Unspecified dementia with behavioral disturbance: Secondary | ICD-10-CM

## 2013-06-26 DIAGNOSIS — F418 Other specified anxiety disorders: Secondary | ICD-10-CM

## 2013-06-29 ENCOUNTER — Encounter: Payer: Self-pay | Admitting: Adult Health

## 2013-06-29 DIAGNOSIS — E1149 Type 2 diabetes mellitus with other diabetic neurological complication: Secondary | ICD-10-CM | POA: Insufficient documentation

## 2013-06-29 DIAGNOSIS — F29 Unspecified psychosis not due to a substance or known physiological condition: Secondary | ICD-10-CM | POA: Insufficient documentation

## 2013-06-29 DIAGNOSIS — F418 Other specified anxiety disorders: Secondary | ICD-10-CM | POA: Insufficient documentation

## 2013-06-29 DIAGNOSIS — F0391 Unspecified dementia with behavioral disturbance: Secondary | ICD-10-CM | POA: Insufficient documentation

## 2013-06-29 DIAGNOSIS — I10 Essential (primary) hypertension: Secondary | ICD-10-CM | POA: Insufficient documentation

## 2013-06-29 DIAGNOSIS — F03918 Unspecified dementia, unspecified severity, with other behavioral disturbance: Secondary | ICD-10-CM | POA: Insufficient documentation

## 2013-06-29 NOTE — Progress Notes (Signed)
Patient ID: Adrienne Lee, female   DOB: Mar 13, 1946, 68 y.o.   MRN: [id number]     [facility]  Allergies  Allergen Reactions  . Ace Inhibitors   . Bactrim [Sulfamethoxazole-Tmp Ds]   . Codeine   . Morphine And Related   . Oxycodone   . Penicillins   . Phenergan [Promethazine Hcl]     Chief Complaint  Patient presents with  . Medical Managment of Chronic Issues    HPI:  She is being seen today for the management of her chronic illnesses. Overall her status is without change. There are no concerns being voiced at this time. She is not voicing any concerns at this time.     Past Medical History  Diagnosis Date  . Depression   . Diabetes mellitus without complication   . Hypertension   . Hyperlipidemia   . Stroke   . Myocardial infarction   . CAD (coronary artery disease)   . Gait abnormality   . Dementia with behavioral disturbance   . FTT (failure to thrive) in adult     Past Surgical History  Procedure Laterality Date  . Coronary artery bypass graft      VITAL SIGNS BP 137/60  Pulse 70  Ht 5' 6"  (1.676 m)  Wt 192 lb (87.091 kg)  BMI 31.00 kg/m2   Patient's Medications  New Prescriptions   No medications on file  Previous Medications   AMLODIPINE (NORVASC) 10 MG TABLET    Take 10 mg by mouth daily.   DONEPEZIL (ARICEPT) 10 MG TABLET    Take 10 mg by mouth at bedtime.   ESCITALOPRAM (LEXAPRO) 20 MG TABLET    Take 20 mg by mouth daily.   HYDRALAZINE (APRESOLINE) 25 MG TABLET    Take 25 mg by mouth 2 (two) times daily.   INSULIN DETEMIR (LEVEMIR) 100 UNIT/ML INJECTION    Inject 10 Units into the skin at bedtime.   INSULIN LISPRO (HUMALOG) 100 UNIT/ML INJECTION    Inject 4 Units into the skin 3 (three) times daily before meals. Also SSI prior to meals   LORAZEPAM (ATIVAN) 0.5 MG TABLET    Take 0.5 mg by mouth every 6 (six) hours as needed for anxiety.   METOPROLOL TARTRATE (LOPRESSOR) 25 MG TABLET    Take 25 mg by mouth daily.   RISPERIDONE (RISPERDAL) 1 MG  TABLET    Take 1 mg by mouth 2 (two) times daily.  Modified Medications   No medications on file  Discontinued Medications   No medications on file    SIGNIFICANT DIAGNOSTIC EXAMS   LABS REVIEWED:   06-01-13: wbc 9.3 hgb 11.9; hct 36.6; mcv 88; plt 177; glucose 87; bun 23; creat 1.06; k+4.8; na++141 alk phos 74; ast 9; alt 5; albumin 3.3; chol 141; ldl 87; trig 115; vit b12: 302; tsh  3.22; hgb a1c 6.8      Review of Systems  Constitutional: Negative for malaise/fatigue.  Respiratory: Negative for cough.   Cardiovascular: Negative for chest pain.  Gastrointestinal: Negative for abdominal pain.  Musculoskeletal: Negative for joint pain and myalgias.  Skin: Negative.   Psychiatric/Behavioral: The patient is not nervous/anxious.       Physical Exam  Constitutional: She appears well-developed and well-nourished. No distress.  Overweight   Neck: Neck supple. No JVD present.  Cardiovascular: Normal rate, regular rhythm and intact distal pulses.   Respiratory: Effort normal and breath sounds normal.  GI: Soft. Bowel sounds are normal. She exhibits no distension. There  is no tenderness.  Musculoskeletal: Normal range of motion. She exhibits no edema.  Skin: Skin is warm and dry. She is not diaphoretic.  Psychiatric: She has a normal mood and affect.      ASSESSMENT/ PLAN:  1. Hypertension: is stable will continue norvasc 10 mg daily; lopressor 25 mg daily; apresoline 25 mg twice daily and will monitor his status.   2. Diabetes; will continue levemir 10 units nightly and will continue humalog 4 units with meals with an SSI and will monitor   3. Dementia with behavioral disturbance; no change in overall status; will continue aricept  10 mg nightly and will monitor her status   4. Psychosis: no reports of behavioral issues present:; will continue risperdal 1 mg twice daily and will monitor her status.   5. Depression with anxiety; will continue lexapro 20 mg daily and will  continue ativan 0.5 mg every 6 hours as needed and will monitor her status.       Ok Edwards NP [facility]  Contact [phone] Monday through Friday 8am- 5pm  After hours call [phone]

## 2013-07-03 ENCOUNTER — Other Ambulatory Visit: Payer: Self-pay | Admitting: *Deleted

## 2013-07-03 MED ORDER — LORAZEPAM 0.5 MG PO TABS: ORAL_TABLET | ORAL | Status: DC

## 2013-07-04 ENCOUNTER — Other Ambulatory Visit: Payer: Self-pay | Admitting: *Deleted

## 2013-07-04 MED ORDER — LORAZEPAM 0.5 MG PO TABS: ORAL_TABLET | ORAL | Status: DC

## 2013-07-04 NOTE — Telephone Encounter (Signed)
[facility] 

## 2013-07-11 ENCOUNTER — Encounter: Payer: Self-pay | Admitting: Physician Assistant

## 2013-07-30 ENCOUNTER — Non-Acute Institutional Stay (SKILLED_NURSING_FACILITY): Payer: Medicare Other | Admitting: Internal Medicine

## 2013-07-30 DIAGNOSIS — F03918 Unspecified dementia, unspecified severity, with other behavioral disturbance: Secondary | ICD-10-CM

## 2013-07-30 DIAGNOSIS — E1149 Type 2 diabetes mellitus with other diabetic neurological complication: Secondary | ICD-10-CM

## 2013-07-30 DIAGNOSIS — F0391 Unspecified dementia with behavioral disturbance: Secondary | ICD-10-CM

## 2013-07-30 DIAGNOSIS — I2510 Atherosclerotic heart disease of native coronary artery without angina pectoris: Secondary | ICD-10-CM

## 2013-07-30 DIAGNOSIS — I10 Essential (primary) hypertension: Secondary | ICD-10-CM

## 2013-07-30 NOTE — Progress Notes (Signed)
         PROGRESS NOTE  DATE: 07/30/2013  FACILITY: Nursing Home Location: [facility]  LEVEL OF CARE: SNF (31)  Routine Visit  CHIEF COMPLAINT:  Manage dementia, diabetes mellitus and CAD  HISTORY OF PRESENT ILLNESS:  REASSESSMENT OF ONGOING PROBLEM(S):  DEMENTIA: The dementia remaines stable and continues to function adequately in the current living environment with supervision.  The patient has had little changes in behavior. No complications noted from the medications presently being used.  CAD: The angina has been stable. The patient denies dyspnea on exertion, orthopnea, pedal edema, palpitations and paroxysmal nocturnal dyspnea. No complications noted from the medication presently being used. Patient is status post CABG.  DM:pt's DM remains stable.  Pt denies polyuria, polydipsia, polyphagia, changes in vision or hypoglycemic episodes.  No complications noted from the medication presently being used.  Last hemoglobin A1c is: 6.8 in 3-15.  PAST MEDICAL HISTORY : Reviewed.  No changes/see problem list  CURRENT MEDICATIONS: Reviewed per MAR/see medication list  REVIEW OF SYSTEMS:  GENERAL: no change in appetite, no fatigue, no weight changes, no fever, chills or weakness RESPIRATORY: no cough, SOB, DOE, wheezing, hemoptysis CARDIAC: no chest pain, edema or palpitations GI: no abdominal pain, diarrhea, constipation, heart burn, nausea or vomiting  PHYSICAL EXAMINATION  VS:  See VS section  GENERAL: no acute distress, moderate obese body habitus EYES: conjunctivae normal, sclerae normal, normal eye lids NECK: supple, trachea midline, no neck masses, no thyroid tenderness, no thyromegaly LYMPHATICS: no LAN in the neck, no supraclavicular LAN RESPIRATORY: breathing is even & unlabored, BS CTAB CARDIAC: RRR, no murmur,no extra heart sounds, no edema GI: abdomen soft, normal BS, no masses, no tenderness, no hepatomegaly, no splenomegaly PSYCHIATRIC:  the patient is alert & oriented to person, affect & behavior appropriate  LABS/RADIOLOGY: 3-15 CBC normal, creatinine 1.16, total protein 5.2, albumin 3.3 otherwise CMP normal, HDL 31 otherwise phosgene and a normal, vitamin B12 level 302, folate level 10, TSH 3.22  ASSESSMENT/PLAN:  Diabetes mellitus with neurologic complications-well-controlled CAD-stable Dementia-stable Hypertension-well controlled Depression-continue Lexapro Anxiety-stable  CPT CODE: [id number]  Angela CoxGayani Y Kristia Jupiter, MD [facility] [phone]

## 2013-08-01 ENCOUNTER — Other Ambulatory Visit: Payer: Self-pay | Admitting: *Deleted

## 2013-08-01 MED ORDER — LORAZEPAM 0.5 MG PO TABS: ORAL_TABLET | ORAL | Status: DC

## 2013-08-01 NOTE — Telephone Encounter (Signed)
[facility] 

## 2013-09-14 ENCOUNTER — Non-Acute Institutional Stay (SKILLED_NURSING_FACILITY): Payer: Medicare Other | Admitting: Internal Medicine

## 2013-09-14 DIAGNOSIS — F0391 Unspecified dementia with behavioral disturbance: Secondary | ICD-10-CM

## 2013-09-14 DIAGNOSIS — I209 Angina pectoris, unspecified: Secondary | ICD-10-CM

## 2013-09-14 DIAGNOSIS — F03918 Unspecified dementia, unspecified severity, with other behavioral disturbance: Secondary | ICD-10-CM

## 2013-09-14 DIAGNOSIS — I10 Essential (primary) hypertension: Secondary | ICD-10-CM

## 2013-09-14 DIAGNOSIS — I25118 Atherosclerotic heart disease of native coronary artery with other forms of angina pectoris: Secondary | ICD-10-CM

## 2013-09-14 DIAGNOSIS — I2510 Atherosclerotic heart disease of native coronary artery without angina pectoris: Secondary | ICD-10-CM

## 2013-09-14 DIAGNOSIS — E1149 Type 2 diabetes mellitus with other diabetic neurological complication: Secondary | ICD-10-CM

## 2013-09-14 NOTE — Progress Notes (Signed)
         PROGRESS NOTE  DATE: 09-14-13  FACILITY: Nursing Home [facility]: [facility]  LEVEL OF CARE: SNF (31)  Routine Visit  CHIEF COMPLAINT:  Manage dementia, diabetes mellitus and CAD  HISTORY OF PRESENT ILLNESS:  REASSESSMENT OF ONGOING PROBLEM(S):  DEMENTIA: The dementia remaines stable and continues to function adequately in the current living environment with supervision.  The patient has had little changes in behavior. No complications noted from the medications presently being used. Patient is a poor historian.  CAD: The angina has been stable. The staff deny dyspnea on exertion, orthopnea, pedal edema, palpitations and paroxysmal nocturnal dyspnea. No complications noted from the medication presently being used. Patient is status post CABG.  DM:pt's DM remains stable. Staff deny polyuria, polydipsia, polyphagia, changes in vision or hypoglycemic episodes.  No complications noted from the medication presently being used.  Last hemoglobin A1c is: 6.8 in 3-15.  PAST MEDICAL HISTORY : Reviewed.  No changes/see problem list  CURRENT MEDICATIONS: Reviewed per MAR/see medication list  REVIEW OF SYSTEMS: Unobtainable due to dementia  PHYSICAL EXAMINATION  VS:  See VS section  GENERAL: no acute distress, moderate obese body habitus NECK: supple, trachea midline, no neck masses, no thyroid tenderness, no thyromegaly RESPIRATORY: breathing is even & unlabored, BS CTAB CARDIAC: RRR, no murmur,no extra heart sounds, no edema GI: abdomen soft, normal BS, no masses, no tenderness, no hepatomegaly, no splenomegaly PSYCHIATRIC: the patient is alert & unable to assess orientation, affect & behavior appropriate  LABS/RADIOLOGY: 3-15 CBC normal, creatinine 1.16, total protein 5.2, albumin 3.3 otherwise CMP normal, HDL 31 otherwise phosgene and a normal, vitamin B12 level 302, folate level 10, TSH 3.22  ASSESSMENT/PLAN:  Diabetes mellitus with neurologic  complications-well-controlled CAD-stable Dementia-stable. Risperdal increased. Hypertension-well controlled Depression-continue Lexapro Anxiety-stable  CPT CODE: 1610999308  Angela CoxGayani Y Dasanayaka, MD [facility] [phone]

## 2013-10-18 ENCOUNTER — Non-Acute Institutional Stay (SKILLED_NURSING_FACILITY): Payer: Medicare Other | Admitting: Internal Medicine

## 2013-10-18 DIAGNOSIS — I25118 Atherosclerotic heart disease of native coronary artery with other forms of angina pectoris: Secondary | ICD-10-CM

## 2013-10-18 DIAGNOSIS — I209 Angina pectoris, unspecified: Secondary | ICD-10-CM

## 2013-10-18 DIAGNOSIS — I10 Essential (primary) hypertension: Secondary | ICD-10-CM

## 2013-10-18 DIAGNOSIS — F03918 Unspecified dementia, unspecified severity, with other behavioral disturbance: Secondary | ICD-10-CM

## 2013-10-18 DIAGNOSIS — F0391 Unspecified dementia with behavioral disturbance: Secondary | ICD-10-CM

## 2013-10-18 DIAGNOSIS — E1149 Type 2 diabetes mellitus with other diabetic neurological complication: Secondary | ICD-10-CM

## 2013-10-18 DIAGNOSIS — I2510 Atherosclerotic heart disease of native coronary artery without angina pectoris: Secondary | ICD-10-CM

## 2013-10-18 NOTE — Progress Notes (Signed)
         PROGRESS NOTE  DATE: 10-18-13  FACILITY: Nursing Home [facility]  LEVEL OF CARE: SNF (31)  Routine Visit  CHIEF COMPLAINT:  Manage dementia, diabetes mellitus and CAD  HISTORY OF PRESENT ILLNESS:  REASSESSMENT OF ONGOING PROBLEM(S):  DEMENTIA: The dementia remaines stable and continues to function adequately in the current living environment with supervision.  The patient has had little changes in behavior. No complications noted from the medications presently being used. Patient is a poor historian.  CAD: The angina has been stable. The staff deny dyspnea on exertion, orthopnea, pedal edema, palpitations and paroxysmal nocturnal dyspnea. No complications noted from the medication presently being used. Patient is status post CABG.  DM:pt's DM remains stable. Staff deny polyuria, polydipsia, polyphagia, changes in vision or hypoglycemic episodes.  No complications noted from the medication presently being used.  Last hemoglobin A1c is: 6.8 in 3-15.  PAST MEDICAL HISTORY : Reviewed.  No changes/see problem list  CURRENT MEDICATIONS: Reviewed per MAR/see medication list  REVIEW OF SYSTEMS: Unobtainable due to dementia  PHYSICAL EXAMINATION  VS:  See VS section  GENERAL: no acute distress, moderate obese body habitus NECK: supple, trachea midline, no neck masses, no thyroid tenderness, no thyromegaly RESPIRATORY: breathing is even & unlabored, BS CTAB CARDIAC: RRR, no murmur,no extra heart sounds, no edema GI: abdomen soft, normal BS, no masses, no tenderness, no hepatomegaly, no splenomegaly PSYCHIATRIC: the patient is alert & unable to assess orientation, affect & behavior appropriate  LABS/RADIOLOGY: 3-15 CBC normal, creatinine 1.16, total protein 5.2, albumin 3.3 otherwise CMP normal, HDL 31 otherwise phosgene and a normal, vitamin B12 level 302, folate level 10, TSH 3.22  ASSESSMENT/PLAN:  Diabetes mellitus with neurologic  complications-well-controlled CAD-stable Dementia-stable. Risperdal was decreased. Hypertension-last blood pressure was elevated. Anxiety-stable  CPT CODE: 4540999308  Angela CoxGayani Y Dasanayaka, MD [facility] [phone]

## 2013-10-24 ENCOUNTER — Non-Acute Institutional Stay (SKILLED_NURSING_FACILITY): Payer: Medicare Other | Admitting: Internal Medicine

## 2013-10-24 ENCOUNTER — Other Ambulatory Visit: Payer: Self-pay | Admitting: *Deleted

## 2013-10-24 DIAGNOSIS — R5383 Other fatigue: Secondary | ICD-10-CM

## 2013-10-24 DIAGNOSIS — R5381 Other malaise: Secondary | ICD-10-CM

## 2013-10-24 MED ORDER — LORAZEPAM 0.5 MG PO TABS: ORAL_TABLET | ORAL | Status: AC

## 2013-10-25 NOTE — Progress Notes (Signed)
         PROGRESS NOTE  DATE: 10/24/2013  [facility]ACILITY[facility]  LEVEL OF CARE: SNF (31)  Acute Visit  CHIEF COMPLAINT:  Manage lethargy  HISTORY OF PRESENT ILLNESS: I was requested by the staff to assess the patient regarding above problem(s):  Staff reports that patient is sleeping all day and non-verble. They aren't requesting me to assess the use of lorazepam. Patient is lethargic and does not follow commands. There is no temporal relationship. There are no other associated signs and symptoms.  PAST MEDICAL HISTORY : Reviewed.  No changes/see problem list  CURRENT MEDICATIONS: Reviewed per MAR/see medication list  REVIEW OF SYSTEMS: Unobtainable due to lethargy.  PHYSICAL EXAMINATION  VS: see VS section  GENERAL: no acute distress, normal body habitus NECK: supple, trachea midline, no neck masses, no thyroid tenderness, no thyromegaly RESPIRATORY: breathing is even & unlabored, BS CTAB CARDIAC: RRR, no murmur,no extra heart sounds, no edema GI: abdomen soft, normal BS, no masses, no tenderness, no hepatomegaly, no splenomegaly PSYCHIATRIC: the patient is minimally alert, & disoriented, depressed affect & behavior appropriate  ASSESSMENT/PLAN:  Lethargy- taper off Ativan and start Ativan 0.5 mg twice a day when necessary  CPT CODE: 1610999308  Angela CoxGayani Y Carinne Brandenburger, MD [facility] [phone]

## 2013-11-26 ENCOUNTER — Non-Acute Institutional Stay (SKILLED_NURSING_FACILITY): Payer: Medicare Other | Admitting: Internal Medicine

## 2013-11-26 DIAGNOSIS — E1149 Type 2 diabetes mellitus with other diabetic neurological complication: Secondary | ICD-10-CM

## 2013-11-26 DIAGNOSIS — F0391 Unspecified dementia with behavioral disturbance: Secondary | ICD-10-CM

## 2013-11-26 DIAGNOSIS — F03918 Unspecified dementia, unspecified severity, with other behavioral disturbance: Secondary | ICD-10-CM

## 2013-11-26 DIAGNOSIS — I209 Angina pectoris, unspecified: Secondary | ICD-10-CM

## 2013-11-26 DIAGNOSIS — I2510 Atherosclerotic heart disease of native coronary artery without angina pectoris: Secondary | ICD-10-CM

## 2013-11-26 DIAGNOSIS — I25118 Atherosclerotic heart disease of native coronary artery with other forms of angina pectoris: Secondary | ICD-10-CM

## 2013-11-26 DIAGNOSIS — L210 Seborrhea capitis: Secondary | ICD-10-CM

## 2013-11-26 DIAGNOSIS — L218 Other seborrheic dermatitis: Secondary | ICD-10-CM

## 2013-11-26 NOTE — Progress Notes (Signed)
         PROGRESS NOTE  DATE: 11-26-13  FACILITY: Nursing Home [facility]  LEVEL OF CARE: SNF (31)  Routine Visit  CHIEF COMPLAINT:  Manage dementia, diabetes mellitus and CAD  HISTORY OF PRESENT ILLNESS:  REASSESSMENT OF ONGOING PROBLEM(S):  DEMENTIA: The dementia remaines stable and continues to function adequately in the current living environment with supervision.  The patient has had little changes in behavior. No complications noted from the medications presently being used. Patient is a poor historian.  CAD: The angina has been stable. The staff deny dyspnea on exertion, orthopnea, pedal edema, palpitations and paroxysmal nocturnal dyspnea. No complications noted from the medication presently being used. Patient is status post CABG.  DM:pt's DM remains stable. Staff deny polyuria, polydipsia, polyphagia, changes in vision or hypoglycemic episodes.  No complications noted from the medication presently being used.  Last hemoglobin A1c is: 6.8 in 3-15, in 8-15 hemoglobin A1c 6.3  PAST MEDICAL HISTORY : Reviewed.  No changes/see problem list  CURRENT MEDICATIONS: Reviewed per MAR/see medication list  REVIEW OF SYSTEMS: Unobtainable due to dementia  PHYSICAL EXAMINATION  VS:  See VS section  GENERAL: no acute distress, moderate obese body habitus SKIN: Scalp has dandruff EYES: Normal sclerae, normal conjunctivae, no discharge NECK: supple, trachea midline, no neck masses, no thyroid tenderness, no thyromegaly LYMPHATICS: No cervical lymphadenopathy, no supraclavicular lymphadenopathy RESPIRATORY: breathing is even & unlabored, BS CTAB CARDIAC: RRR, no murmur,no extra heart sounds, no edema GI: abdomen soft, normal BS, no masses, no tenderness, no hepatomegaly, no splenomegaly PSYCHIATRIC: the patient is alert & unable to assess orientation, affect & behavior appropriate  LABS/RADIOLOGY: 3-15 CBC normal, creatinine 1.16, total protein 5.2,  albumin 3.3 otherwise CMP normal, HDL 31 otherwise FLP normal, vitamin B12 level 302, folate level 10, TSH 3.22  ASSESSMENT/PLAN:  Diabetes mellitus with neurologic complications-well-controlled CAD-stable Dementia-stable.  Dandruff-new problem. Start head and shoulders shampoo Hypertension-well controlled Anxiety-stable  CPT CODE: 16109  Angela Cox, MD [facility] [phone]

## 2013-12-05 ENCOUNTER — Non-Acute Institutional Stay (SKILLED_NURSING_FACILITY): Payer: Medicare Other | Admitting: Internal Medicine

## 2013-12-05 DIAGNOSIS — R52 Pain, unspecified: Secondary | ICD-10-CM

## 2013-12-06 ENCOUNTER — Other Ambulatory Visit: Payer: Self-pay | Admitting: *Deleted

## 2013-12-06 MED ORDER — TRAMADOL HCL 50 MG PO TABS: ORAL_TABLET | ORAL | Status: AC

## 2013-12-12 ENCOUNTER — Non-Acute Institutional Stay (SKILLED_NURSING_FACILITY): Payer: Medicare Other | Admitting: Internal Medicine

## 2013-12-12 DIAGNOSIS — R05 Cough: Secondary | ICD-10-CM

## 2013-12-12 DIAGNOSIS — R059 Cough, unspecified: Secondary | ICD-10-CM

## 2013-12-12 NOTE — Progress Notes (Signed)
Patient ID: Adrienne Lee, female   DOB: 12-Feb-1946, 68 y.o.   MRN: [id number]           PROGRESS NOTE  DATE: 12/05/2013          FACILITY:  [facility]  LEVEL OF CARE: SNF (31)  Acute Visit  CHIEF COMPLAINT:  Manage pain.    HISTORY OF PRESENT ILLNESS: I was requested by the staff to assess the patient regarding above problem(s):  Staff report that patient is crying at times, saying her whole body hurts.  P.r.n. Tylenol does not seem to be effective.  Patient is a poor historian.  I am unable to obtain any history.    PAST MEDICAL HISTORY : Reviewed.  No changes/see problem list  CURRENT MEDICATIONS: Reviewed per MAR/see medication list  REVIEW OF SYSTEMS:  Unobtainable.  Patient is a poor historian.       PHYSICAL EXAMINATION  VS: see VS section  GENERAL: no acute distress, moderately obese body habitus NECK: supple, trachea midline, no neck masses, no thyroid tenderness, no thyromegaly RESPIRATORY: breathing is even & unlabored, BS CTAB CARDIAC: RRR, no murmur,no extra heart sounds, no edema GI: abdomen soft, normal BS, no masses, no tenderness, no hepatomegaly, no splenomegaly PSYCHIATRIC: the patient is alert, unable to assess orientation, affect & behavior appropriate  ASSESSMENT/PLAN:  Diffuse pain.  New problem.  Patient is allergic to multiple narcotic pain medications.  We will use Tramadol 50 mg b.i.d. p.r.n.        CPT CODE: 40981          Angela Cox, MD [facility] [phone]

## 2013-12-14 ENCOUNTER — Non-Acute Institutional Stay (SKILLED_NURSING_FACILITY): Payer: Medicare Other | Admitting: Internal Medicine

## 2013-12-14 DIAGNOSIS — J189 Pneumonia, unspecified organism: Secondary | ICD-10-CM

## 2013-12-18 NOTE — Progress Notes (Signed)
Patient ID: Adrienne Lee, female   DOB: 10-21-1945, 68 y.o.   MRN: [id number]           PROGRESS NOTE  DATE: 12/12/2013         FACILITY:  [facility]  LEVEL OF CARE: SNF (31)  Acute Visit  CHIEF COMPLAINT:  Manage cough.    HISTORY OF PRESENT ILLNESS: I was requested by the staff to assess the patient regarding above problem(s):  Staff report that patient has a deep, barking cough noted since yesterday.  Patient is a poor historian and does not follow commands.    Staff do not report respiratory insufficiency.  They cannot identify precipitating or alleviating factors.  There is no temporal relationship.    PAST MEDICAL HISTORY : Reviewed.  No changes/see problem list  CURRENT MEDICATIONS: Reviewed per MAR/see medication list  REVIEW OF SYSTEMS:  Unobtainable due to dementia.    PHYSICAL EXAMINATION  VS: see VS section  GENERAL: no acute distress, moderately obese body habitus NECK: supple, trachea midline, no neck masses, no thyroid tenderness, no thyromegaly LYMPHATICS: no LAN in the neck, no supraclavicular LAN RESPIRATORY: decreased breath sounds, breathing is even & unlabored, BS CTAB CARDIAC: RRR, no murmur,no extra heart sounds, no edema GI: abdomen soft, normal BS, no masses, no tenderness, no hepatomegaly, no splenomegaly  ASSESSMENT/PLAN:  Cough.  New problem.  Obtain chest x-ray.  Start Mucinex 600 mg b.i.d. for one week.    CPT CODE: 62130           Angela Cox, MD [facility] [phone]

## 2013-12-19 ENCOUNTER — Non-Acute Institutional Stay (SKILLED_NURSING_FACILITY): Payer: Medicare Other | Admitting: Internal Medicine

## 2013-12-19 DIAGNOSIS — F03918 Unspecified dementia, unspecified severity, with other behavioral disturbance: Secondary | ICD-10-CM

## 2013-12-19 DIAGNOSIS — I2510 Atherosclerotic heart disease of native coronary artery without angina pectoris: Secondary | ICD-10-CM

## 2013-12-19 DIAGNOSIS — I10 Essential (primary) hypertension: Secondary | ICD-10-CM

## 2013-12-19 DIAGNOSIS — E1149 Type 2 diabetes mellitus with other diabetic neurological complication: Secondary | ICD-10-CM

## 2013-12-19 DIAGNOSIS — F0391 Unspecified dementia with behavioral disturbance: Secondary | ICD-10-CM

## 2013-12-19 NOTE — Progress Notes (Signed)
Patient ID: Adrienne Lee, female   DOB: 1945-04-17, 68 y.o.   MRN: [id number]           PROGRESS NOTE  DATE: 12/14/2013           FACILITY:  [facility]  LEVEL OF CARE: SNF (31)  Acute Visit  CHIEF COMPLAINT:  Manage pneumonia.     HISTORY OF PRESENT ILLNESS: I was requested by the staff to assess the patient regarding above problem(s):  Due to cough and congestion, patient had a chest x-ray done on 12/12/2013 which shows an early infiltrate of the right lower lung.  Patient does not follow commands.   Staff do not report respiratory insufficiency or fever.    PAST MEDICAL HISTORY : Reviewed.  No changes/see problem list  CURRENT MEDICATIONS: Reviewed per MAR/see medication list  REVIEW OF SYSTEMS:  Unobtainable.  Patient is a poor historian.       PHYSICAL EXAMINATION  VS: see VS section  GENERAL: no acute distress, moderately obese body habitus EYES: conjunctivae normal, sclerae normal, normal eye lids NECK: supple, trachea midline, no neck masses, no thyroid tenderness, no thyromegaly LYMPHATICS: no LAN in the neck, no supraclavicular LAN RESPIRATORY: decreased breath sounds, breathing is even & unlabored, BS CTAB CARDIAC: RRR, no murmur,no extra heart sounds, no edema GI: abdomen soft, normal BS, no masses, no tenderness, no hepatomegaly, no splenomegaly PSYCHIATRIC: the patient is alert, unable to assess orientation, affect & behavior appropriate  ASSESSMENT/PLAN:  Right lower lung pneumonia.  New onset.  Significant problem.  Start doxycycline 100 mg b.i.d. for one week and probiotics b.i.d. for 14 days.     CPT CODE: 82956         Angela Cox, MD [facility] [phone]

## 2013-12-24 NOTE — Progress Notes (Signed)
         PROGRESS NOTE  DATE: 12-19-13  FACILITY: Nursing Home Location: [facility]  LEVEL OF CARE: SNF (31)  Routine Visit  CHIEF COMPLAINT:  Manage dementia, diabetes mellitus and CAD  HISTORY OF PRESENT ILLNESS:  REASSESSMENT OF ONGOING PROBLEM(S):  DEMENTIA: The dementia remaines stable and continues to function adequately in the current living environment with supervision.  The patient has had little changes in behavior. No complications noted from the medications presently being used. Patient is a poor historian.  CAD: The angina has been stable. The staff deny dyspnea on exertion, orthopnea, pedal edema, palpitations and paroxysmal nocturnal dyspnea. No complications noted from the medication presently being used. Patient is status post CABG.  DM:pt's DM remains stable. Staff deny polyuria, polydipsia, polyphagia, changes in vision or hypoglycemic episodes.  No complications noted from the medication presently being used.  Last hemoglobin A1c is: 6.8 in 3-15, in 8-15 hemoglobin A1c 6.3  PAST MEDICAL HISTORY : Reviewed.  No changes/see problem list  CURRENT MEDICATIONS: Reviewed per MAR/see medication list  REVIEW OF SYSTEMS: Unobtainable due to dementia  PHYSICAL EXAMINATION  VS:  See VS section  GENERAL: no acute distress, moderate obese body habitus NECK: supple, trachea midline, no neck masses, no thyroid tenderness, no thyromegaly RESPIRATORY: breathing is even & unlabored, BS CTAB CARDIAC: RRR, no murmur,no extra heart sounds, no edema GI: abdomen soft, normal BS, no masses, no tenderness, no hepatomegaly, no splenomegaly PSYCHIATRIC: the patient is alert & unable to assess orientation, affect & behavior appropriate  LABS/RADIOLOGY: 3-15 CBC normal, creatinine 1.16, total protein 5.2, albumin 3.3 otherwise CMP normal, HDL 31 otherwise FLP normal, vitamin B12 level 302, folate level 10, TSH 3.22  ASSESSMENT/PLAN:  Diabetes mellitus with  neurologic complications-well-controlled CAD-stable Dementia-stable.  Hypertension-well controlled Anxiety-stable check CBC and CMP  CPT CODE: 16109  Angela Cox, MD [facility] [phone]

## 2014-01-02 ENCOUNTER — Non-Acute Institutional Stay (SKILLED_NURSING_FACILITY): Payer: Medicare Other | Admitting: Internal Medicine

## 2014-01-02 DIAGNOSIS — R630 Anorexia: Secondary | ICD-10-CM

## 2014-01-09 NOTE — Progress Notes (Signed)
Patient ID: Adrienne MarcusLynda P Gedney, female   DOB: Aug 09, 1945, 68 y.o.   MRN: [id number]           PROGRESS NOTE  DATE: 01/02/2014          [facility]  LEVEL OF CARE: SNF (31)  Acute Visit  CHIEF COMPLAINT:  Manage lack of appetite.    HISTORY OF PRESENT ILLNESS: I was requested by the staff to assess the patient regarding above problem(s):  Staff report that patient is not eating much.  Ate 25% of breakfast this morning.  Patient is a poor historian.  On 12/11/2013:  Weight 171.5.  On 12/03/2013:  Weight 163.5.  On 11/07/2013:  Weight 175.  10/10/2013:  Weight 180.    PAST MEDICAL HISTORY : Reviewed.  No changes/see problem list  CURRENT MEDICATIONS: Reviewed per MAR/see medication list  REVIEW OF SYSTEMS:  Unobtainable.  Patient is a poor historian.       PHYSICAL EXAMINATION  VS: see VS section  GENERAL: no acute distress, moderately obese body habitus NECK: supple, trachea midline, no neck masses, no thyroid tenderness, no thyromegaly RESPIRATORY: breathing is even & unlabored, BS CTAB CARDIAC: RRR, no murmur,no extra heart sounds, no edema GI: abdomen soft, normal BS, no masses, no tenderness, no hepatomegaly, no splenomegaly PSYCHIATRIC: the patient is alert & oriented to person, affect & behavior appropriate  LABS/RADIOLOGY:  12/11/2013:  CBC normal.    Glucose 132, total protein 5.2, albumin 3.2, otherwise CMP normal.    ASSESSMENT/PLAN:  Lack of appetite.  Patient's weight is stable.  We will check TSH and obtain Dietary consult.  No need for an appetite stimulant at this point.    CPT CODE: 4098199308             Angela CoxGayani Y Carime Dinkel, MD [facility] [phone]

## 2014-01-21 ENCOUNTER — Non-Acute Institutional Stay (SKILLED_NURSING_FACILITY): Payer: Medicare Other | Admitting: Internal Medicine

## 2014-01-21 DIAGNOSIS — I10 Essential (primary) hypertension: Secondary | ICD-10-CM

## 2014-01-21 DIAGNOSIS — F03918 Unspecified dementia, unspecified severity, with other behavioral disturbance: Secondary | ICD-10-CM

## 2014-01-21 DIAGNOSIS — E1142 Type 2 diabetes mellitus with diabetic polyneuropathy: Secondary | ICD-10-CM

## 2014-01-21 DIAGNOSIS — F0391 Unspecified dementia with behavioral disturbance: Secondary | ICD-10-CM

## 2014-01-21 DIAGNOSIS — I2510 Atherosclerotic heart disease of native coronary artery without angina pectoris: Secondary | ICD-10-CM

## 2014-01-22 DIAGNOSIS — E119 Type 2 diabetes mellitus without complications: Secondary | ICD-10-CM | POA: Insufficient documentation

## 2014-01-22 NOTE — Progress Notes (Signed)
         PROGRESS NOTE  DATE: 01-21-14  FACILITY: Nursing Home [facility]  LEVEL OF CARE: SNF (31)  Routine Visit  CHIEF COMPLAINT:  Manage dementia, diabetes mellitus and CAD  HISTORY OF PRESENT ILLNESS:  REASSESSMENT OF ONGOING PROBLEM(S):  DEMENTIA: The dementia remaines stable and continues to function adequately in the current living environment with supervision.  The patient has had little changes in behavior. No complications noted from the medications presently being used. Patient is a poor historian.  CAD: The angina has been stable. The staff deny dyspnea on exertion, orthopnea, pedal edema, palpitations and paroxysmal nocturnal dyspnea. No complications noted from the medication presently being used. Patient is status post CABG.  DM:pt's DM remains stable. Staff deny polyuria, polydipsia, polyphagia, changes in vision or hypoglycemic episodes.  No complications noted from the medication presently being used.  Last hemoglobin A1c is: 6.8 in 3-15, in 8-15 hemoglobin A1c 6.3  PAST MEDICAL HISTORY : Reviewed.  No changes/see problem list  CURRENT MEDICATIONS: Reviewed per MAR/see medication list  REVIEW OF SYSTEMS: Unobtainable due to dementia  PHYSICAL EXAMINATION  VS:  See VS section  GENERAL: no acute distress, moderate obese body habitus NECK: supple, trachea midline, no neck masses, no thyroid tenderness, no thyromegaly RESPIRATORY: breathing is even & unlabored, BS CTAB CARDIAC: RRR, no murmur,no extra heart sounds, no edema GI: abdomen soft, normal BS, no masses, no tenderness, no hepatomegaly, no splenomegaly PSYCHIATRIC: the patient is alert & unable to assess orientation, affect & behavior appropriate  LABS/RADIOLOGY: 10-15 TSH 3.99 9-15 cbc nl, glc 132, tp 5.2, alb 3.2 ow cmp nl 3-15 CBC normal, creatinine 1.16, total protein 5.2, albumin 3.3 otherwise CMP normal, HDL 31 otherwise FLP normal, vitamin B12 level 302, folate  level 10, TSH 3.22  ASSESSMENT/PLAN:  Diabetes mellitus with neurologic complications-well-controlled CAD-stable Dementia-stable.  Hypertension-well controlled  CPT CODE: 9147899308  Angela CoxGayani Y Dasanayaka, MD [facility] [phone]

## 2014-06-10 ENCOUNTER — Emergency Department (HOSPITAL_COMMUNITY)
Admission: EM | Admit: 2014-06-10 | Discharge: 2014-06-28 | Disposition: E | Payer: Medicare Other | Attending: Emergency Medicine | Admitting: Emergency Medicine

## 2014-06-10 ENCOUNTER — Encounter (HOSPITAL_COMMUNITY): Payer: Self-pay | Admitting: *Deleted

## 2014-06-10 ENCOUNTER — Emergency Department (HOSPITAL_COMMUNITY): Admission: EM | Admit: 2014-06-10 | Payer: TRICARE For Life (TFL) | Source: Home / Self Care

## 2014-06-10 DIAGNOSIS — I252 Old myocardial infarction: Secondary | ICD-10-CM | POA: Diagnosis not present

## 2014-06-10 DIAGNOSIS — N189 Chronic kidney disease, unspecified: Secondary | ICD-10-CM | POA: Diagnosis not present

## 2014-06-10 DIAGNOSIS — F0391 Unspecified dementia with behavioral disturbance: Secondary | ICD-10-CM | POA: Diagnosis not present

## 2014-06-10 DIAGNOSIS — I2510 Atherosclerotic heart disease of native coronary artery without angina pectoris: Secondary | ICD-10-CM | POA: Insufficient documentation

## 2014-06-10 DIAGNOSIS — Z87440 Personal history of urinary (tract) infections: Secondary | ICD-10-CM | POA: Insufficient documentation

## 2014-06-10 DIAGNOSIS — K219 Gastro-esophageal reflux disease without esophagitis: Secondary | ICD-10-CM | POA: Insufficient documentation

## 2014-06-10 DIAGNOSIS — I469 Cardiac arrest, cause unspecified: Secondary | ICD-10-CM | POA: Diagnosis not present

## 2014-06-10 DIAGNOSIS — Z794 Long term (current) use of insulin: Secondary | ICD-10-CM | POA: Diagnosis not present

## 2014-06-10 DIAGNOSIS — Z8669 Personal history of other diseases of the nervous system and sense organs: Secondary | ICD-10-CM | POA: Diagnosis not present

## 2014-06-10 DIAGNOSIS — Z8739 Personal history of other diseases of the musculoskeletal system and connective tissue: Secondary | ICD-10-CM | POA: Insufficient documentation

## 2014-06-10 DIAGNOSIS — Z951 Presence of aortocoronary bypass graft: Secondary | ICD-10-CM | POA: Diagnosis not present

## 2014-06-10 DIAGNOSIS — Z880 Allergy status to penicillin: Secondary | ICD-10-CM | POA: Diagnosis not present

## 2014-06-10 DIAGNOSIS — Z79899 Other long term (current) drug therapy: Secondary | ICD-10-CM | POA: Insufficient documentation

## 2014-06-10 DIAGNOSIS — Z7982 Long term (current) use of aspirin: Secondary | ICD-10-CM | POA: Insufficient documentation

## 2014-06-10 DIAGNOSIS — I129 Hypertensive chronic kidney disease with stage 1 through stage 4 chronic kidney disease, or unspecified chronic kidney disease: Secondary | ICD-10-CM | POA: Diagnosis not present

## 2014-06-10 DIAGNOSIS — F329 Major depressive disorder, single episode, unspecified: Secondary | ICD-10-CM | POA: Insufficient documentation

## 2014-06-10 DIAGNOSIS — Z8673 Personal history of transient ischemic attack (TIA), and cerebral infarction without residual deficits: Secondary | ICD-10-CM | POA: Diagnosis not present

## 2014-06-10 MED ORDER — CALCIUM CHLORIDE 10 % IV SOLN
INTRAVENOUS | Status: DC | PRN
  Administered 2014-06-10: 1 g via INTRAVENOUS

## 2014-06-10 MED ORDER — EPINEPHRINE HCL 0.1 MG/ML IJ SOSY
PREFILLED_SYRINGE | INTRAMUSCULAR | Status: DC | PRN
  Administered 2014-06-10: 1 mg via INTRAVENOUS

## 2014-06-10 MED ORDER — SODIUM BICARBONATE 8.4 % IV SOLN
INTRAVENOUS | Status: DC | PRN
  Administered 2014-06-10: 50 meq via INTRAVENOUS

## 2014-06-10 MED FILL — Medication: Qty: 1 | Status: AC

## 2014-06-28 NOTE — Code Documentation (Signed)
Per Lavella HammockMona Santiego, LPN [facility], pt has no family to contact, Andrews AFBampos, MD notified, Patria Maneampos, MD attempted to call Saintclair HalstedRhesha Carr, guardian of the pt & was unable to reach them

## 2014-06-28 NOTE — ED Notes (Addendum)
[facility], per report pt was found unresponsive @ 15:30, LSN @ 14:30, per EMS pt was in asystole upon arrival, IO placed in L Tibial, CPR started @ 15:35, pt received epi x 14, & was started on epi infusion @ 2 mcg/min,  lucas placed on pt, ROSC @ 16:13 with HR in the 20s, pt in asystole @ 16:18 with resuming of CPR, pt has king airway in place upon arrival to ED, Samuel BoucheLucas in place

## 2014-06-28 NOTE — ED Provider Notes (Signed)
CSN: [id number]     Arrival date & time October 29, 2014  1715 History   First MD Initiated Contact with Patient 0August 02, 2016 1716     No chief complaint on file.    LEVEL V Caveat: CPR, unresponsive    HPI Patient was brought to the emergency department with CPR in progress.  This is a resident of a nursing facility.  As reported to me that she had a pneumonia noted on x-ray today.  When EMS arrived they found the patient asystolic.  CPR was initiated at 1612.  Return of spontaneous circulation with standard ACLS was at 443.  She had return of spontaneous circulation for approximately 5 minutes with epi and pacing and then returned back to asystole.  She has now had ongoing CPR for the last 12-14 minutes.  She remains in asystole at this time.  Medical history reviewed off of MAR.  Intubated by EMS.  Currently ventilating with bloody frothy secretions coming out of the ET tube.  CPR is ongoing   Past Medical History  Diagnosis Date  . Dementia   . Osteoporosis   . Peripheral neuropathy   . Chronic kidney disease   . CVA (cerebral infarction)   . Urinary tract infection     history  . GERD (gastroesophageal reflux disease)   . Coronary artery disease   . Hyperkalemia   . CHF (congestive heart failure)   . Depression   . Diabetes mellitus without complication   . Hypertension   . Hyperlipidemia   . Stroke   . Myocardial infarction   . CAD (coronary artery disease)   . Gait abnormality   . Dementia with behavioral disturbance   . FTT (failure to thrive) in adult    Past Surgical History  Procedure Laterality Date  . Tonsillectomy    . Coronary artery bypass graft      [facility]  . Coronary artery bypass graft     Family History  Problem Relation Age of Onset  . Heart disease Father    History  Substance Use Topics  . Smoking status: Unknown If Ever Smoked  . Smokeless tobacco: Not on file  . Alcohol Use: No   OB History    No data available     Review of Systems  Unable to  perform ROS     Allergies  Ace inhibitors; Augmentin; Bactrim; Bactrim; Codeine; Levaquin; Morphine and related; Oxycodone; Penicillins; Phenergan; and Sulfa antibiotics  Home Medications   Prior to Admission medications   Medication Sig Start Date End Date Taking? Authorizing Provider  amLODipine (NORVASC) 10 MG tablet Take 10 mg by mouth daily.    Historical Provider, MD  amLODipine (NORVASC) 10 MG tablet Take 10 mg by mouth daily.    Historical Provider, MD  aspirin 81 MG tablet Take 81 mg by mouth daily.    Historical Provider, MD  donepezil (ARICEPT) 10 MG tablet Take 10 mg by mouth daily.    Historical Provider, MD  donepezil (ARICEPT) 10 MG tablet Take 10 mg by mouth at bedtime.    Historical Provider, MD  escitalopram (LEXAPRO) 20 MG tablet Take 20 mg by mouth daily.    Historical Provider, MD  furosemide (LASIX) 20 MG tablet Take one tablet by mouth daily. Take an additional tablet as needed for weight gain, shortness of breath or swelling. 08/15/12   Roger A Arguello, PA-C  hydrALAZINE (APRESOLINE) 25 MG tablet Take 1 tablet (25 mg total) by mouth 2 (two) times daily. 08/15/12  Roger A Arguello, PA-C  hydrALAZINE (APRESOLINE) 25 MG tablet Take 25 mg by mouth 2 (two) times daily.    Historical Provider, MD  insulin aspart (NOVOLOG) 100 UNIT/ML injection Inject into the skin as directed.    Historical Provider, MD  insulin detemir (LEVEMIR) 100 UNIT/ML injection Inject 10 Units into the skin at bedtime.    Historical Provider, MD  insulin glargine (LANTUS) 100 UNIT/ML injection Inject 15 Units into the skin at bedtime.     Historical Provider, MD  insulin lispro (HUMALOG) 100 UNIT/ML injection Inject 4 Units into the skin 3 (three) times daily before meals. Also SSI prior to meals    Historical Provider, MD  LORazepam (ATIVAN) 0.5 MG tablet Take one tablet by mouth twice daily for anxiety 10/24/13   Kimber Relic, MD  metFORMIN (GLUCOPHAGE) 500 MG tablet Take 500 mg by mouth 2 (two)  times daily with a meal.    Historical Provider, MD  metoprolol tartrate (LOPRESSOR) 25 MG tablet Take 25 mg by mouth daily.    Historical Provider, MD  metoprolol tartrate (LOPRESSOR) 25 MG tablet Take 25 mg by mouth daily.    Historical Provider, MD  mirtazapine (REMERON) 15 MG tablet Take 0.5 mg by mouth at bedtime.    Historical Provider, MD  pantoprazole (PROTONIX) 40 MG tablet Take 40 mg by mouth daily.    Historical Provider, MD  risperiDONE (RISPERDAL) 1 MG tablet Take 1 mg by mouth 2 (two) times daily.    Historical Provider, MD  rosuvastatin (CRESTOR) 10 MG tablet Take 10 mg by mouth daily.    Historical Provider, MD  sertraline (ZOLOFT) 100 MG tablet Take 100 mg by mouth daily.    Historical Provider, MD  traMADol Janean Sark) 50 MG tablet Take one tablet by mouth twice daily as needed for pain 12/06/13   Sharon Seller, NP   There were no vitals taken for this visit. Physical Exam  Constitutional: She appears well-developed and well-nourished.  HENT:  Head: Atraumatic.  ET Tube in place  Eyes:  Fixed and dilated  Neck: Neck supple.  No crepitus  Cardiovascular:  Pulses present with CPR.  Absent cardiac sounds  Pulmonary/Chest:  Bilateral breath sounds present with bag tube ventilation  Abdominal: She exhibits no distension.  Genitourinary:  Normal external genitalia  Musculoskeletal:  No deformity of extremities  Neurological:  GCS 3  Skin: Skin is warm. No rash noted.  Psychiatric:  Unable to test    ED Course  Procedures (including critical care time)   Cardiopulmonary Resuscitation (CPR) Procedure Note Directed/Performed by: Lyanne Co I personally directed ancillary staff and/or performed CPR in an effort to regain return of spontaneous circulation and to maintain cardiac, neuro and systemic perfusion.      EMERGENCY DEPARTMENT [place] CARDIAC EXAM "Study: Limited Ultrasound of the heart and pericardium" INDICATIONS:Cardiac arrest Multiple views of the  heart and pericardium are obtained with a multi-frequency probe. PERFORMED DV:VOHYWV IMAGES ARCHIVED?: No FINDINGS: No pericardial effusion and No coordinated contractionis LIMITATIONS:  Emergent procedure VIEWS USED: Apical 4 chamber  INTERPRETATION: Cardiac activity absent COMMENT:     Labs Review Labs Reviewed - No data to display  Imaging Review No results found.   EKG Interpretation None      MDM   Final diagnoses:  Cardiac arrest    Medications given on arrival.  Airway in place.  CPR in place.  No return of spontaneous circulation.  Time of death 42    Azalia Bilis, MD  06/14/2014 1740 

## 2014-06-28 NOTE — Code Documentation (Signed)
Chaplain at bedside

## 2014-06-28 NOTE — Code Documentation (Signed)
Patient time of death occurred at 17:05. Patria Maneampos, MD called time of death

## 2014-06-28 NOTE — Code Documentation (Signed)
US used by Ham Lakeampos, MD, no mechanical movement of the heart, death called & 17:05

## 2014-06-28 DEATH — deceased

## 2014-07-16 NOTE — H&P (Signed)
PATIENT NAME:  Corliss MarcusSTROUT, LYNDA P MR#:  [id number] DATE OF BIRTH:  [birth year]  DATE OF ADMISSION:  12/20/2011  PRIMARY CARE PHYSICIAN:  [facility]  REQUESTING PHYSICIAN:  Dr. Enedina FinnerGoli  CHIEF COMPLAINT: Nausea, vomiting, and hyperglycemia.   HISTORY OF PRESENT ILLNESS: The patient is a 69 year old female with a known history of diabetes, hypertension, and dementia who is being admitted for uncontrolled hyperglycemia along with malignant hypertension. The patient was recently followed by DSS as her family was worried that she is unable to take care of herself. She started having nausea and vomiting for the last 2 to 3 days, unable to tolerate any oral solid food. She is followed by a home health agency but she does not get any assistance with medication administration and as per records she is unaware of the correct doses of her medications does and would not know whether she took the most recent dose of insulin either per clinic record.  She is also unable to safely administer medication independently and the Home Health aide is not able to dispense medication. She was seen at the clinic today due to persistent vomiting, elevated blood sugar, memory loss, and inability to self-administer medication, and was transferred here to the Emergency Department after given Zofran. While in the ED, she was found to have a blood sugar of over 500 with creatinine of 1.33 and potassium of 5.3. She was also found to have a urinary tract infection. On talking to the patient, she does not feel anything is wrong with her.   There is a Home Health aide (CNA) sitting next to her who feels that the patient is unable to take care of herself.  She could not provide me much history either.   PAST MEDICAL HISTORY:  1. Hypertension. 2. Diabetes. 3. Dementia.  4. Hyperlipidemia. 5. Osteoporosis.  6. Depression.  7. Neuropathy.  8. Chronic kidney disease.  9. Peripheral neuropathy.  10. History of cerebrovascular accident.   11. Vitamin D deficiency.  12. Recurrent urinary tract infection.  13. Gastroesophageal reflux disease.   ALLERGIES: ACE inhibitor, Augmentin, Bactrim, codeine, Levaquin, morphine, penicillin, Phenergan, sulfa drugs.   PAST SURGICAL HISTORY:  1. Cholecystectomy.  2. Tonsillectomy. 3. C-section. 4. Coronary artery bypass graft.   SOCIAL HISTORY: She lives in [place] with her children. No smoking, alcohol, or drug use. She is followed by a home health agency ([facility] [place], telephone number [phone]).  FAMILY HISTORY: Father with heart disease.   MEDICATIONS AT HOME:  1. Sertraline 100 mg p.o. daily.  2. Amlodipine 10 mg p.o. daily.  3. Aspirin 81 mg p.o. daily.  4. Crestor 10 p.o. at bedtime.  5. Insulin Lantus 30 units subcutaneous at bedtime.  6. Metformin 500 mg p.o. b.i.d.  7. Metoprolol 25 mg p.o. b.i.d.   REVIEW OF SYSTEMS: CONSTITUTIONAL: No fever. Positive for fatigue and weakness. EYES: No blurred or double vision. ENT: No tinnitus or ear pain. RESPIRATORY: No cough, wheezing, or hemoptysis. CARDIOVASCULAR: No chest pain, orthopnea, or edema. GASTROINTESTINAL: Positive for nausea and vomiting. No diarrhea. GENITOURINARY: No dysuria or hematuria. ENDOCRINE: No polyuria or nocturia. HEMATOLOGIC: No anemia or easy bruising. SKIN: No rash or lesion. MUSCULOSKELETAL: No arthritis or muscle cramps. NEUROLOGIC:  History of cerebrovascular accident. No tingling, numbness, or weakness. PSYCHIATRIC: No history of anxiety. Positive for depression.     PHYSICAL EXAMINATION:  VITAL SIGNS: Temperature 99.5, heart rate 100 per minute, respirations 20 per minute, blood pressure 197/81 mmHg. She  is saturating 95% on room air.   GENERAL: The patient is a 69 year old female lying in the bed comfortably without any acute distress.   EYES: Pupils equal, round, reactive to light and accommodation. No scleral icterus. Extraocular muscles intact.   HENT: Head  atraumatic, normocephalic. Oropharynx and nasopharynx clear.   NECK: Supple. No jugular venous distention. No thyroid enlargement or tenderness.   LUNGS: Clear to auscultation bilaterally. No wheezing, rales, rhonchi, or crepitation.   ABDOMEN: Soft, nontender, nondistended.  Bowel sounds present.  No organomegaly or masses.  EXTREMITIES: No pedal edema, cyanosis, or clubbing.   NEUROLOGIC: Nonfocal examination. Cranial nerves III through XII intact. Muscle strength 5/5.  Extremity sensation intact.   PSYCHIATRIC: The patient is alert and oriented to time, place, and person.   SKIN: No obvious rash, lesion, or ulcer.   LABORATORY, DIAGNOSTIC, AND RADIOLOGICAL DATA: Sodium 134, potassium 5.3, chloride 97, CO2 23, BUN 30, creatinine 1.33, blood glucose 537. Normal liver function tests. Normal first set of troponin. Normal TSH. CBC showed white count of 16.8, hemoglobin 16.4, hematocrit 46.9. Urinalysis showed WBC of 881, 3+ bacteria, WBCs in clumps present, 3+ leukocyte esterase, negative nitrite.   Chest x-ray in the Emergency Department showed no evidence of pneumonia.   EKG showed no major ST-T changes.    IMPRESSION AND PLAN:  1. Nonketotic hyperglycemia. We will start her on insulin drip per protocol. Can switch her to home regimen once blood sugar is better controlled, likely less than 150. We will consult diabetic educator as she seemed to have a lot of confusion. She does not know how much metformin she is taking. As per [facility] records, she is supposed to take 2 tablets twice a day but she states she is likely taking only 1 tablet and she is also likely messing up her insulin regimen at home as per the caregiver. Her last A1c was 11.4, which was checked on 12/20/2011. History of very poor compliance.  2. Malignant hypertension. We will resume home medication. Start her on hydralazine as needed for systolic more than 160.  3. Acute on chronic renal failure. We will hydrate  her with IV fluids. This could be diabetic nephropathy. Monitor her renal function. 4. Urinary tract infection. We will start her on IV Rocephin. She has a lot of allergies. We will closely monitor her, obtain urine culture sensitivity.  5. Hyperkalemia. This should improve with hydration and insulin regimen.  6. Leukocytosis, likely due to urinary tract infection. Antibiotics as above.  7. CODE STATUS: FULL CODE.  TOTAL TIME TAKING CARE OF THIS PATIENT: 55 minutes.      ____________________________ Ellamae Sia. Sherryll Burger, MD vss:bjt D: 12/20/2011 21:23:49 ET T: 12/21/2011 07:32:15 ET JOB#: [id number]  cc: Deston Bilyeu S. Sherryll Burger, MD, <Dictator> [facility] Ellamae Sia [facility] MD ELECTRONICALLY SIGNED 12/22/2011 22:55

## 2014-07-16 NOTE — Discharge Summary (Signed)
PATIENT NAME:  Adrienne Lee, Adrienne Lee MR#:  [id number] DATE OF BIRTH:  1946/02/11  DATE OF ADMISSION:  12/20/2011 DATE OF DISCHARGE:  12/23/2011  DISCHARGE DIAGNOSES:  1. Possible acute pyelonephritis and/or Escherichia coli urinary tract infection, improved with antibiotics.  2. Leukocytosis likely due to urinary tract infection.  3. Diabetes, uncontrolled, likely secondary to noncompliance. A1c more than 11.  4. Acute renal failure, likely prerenal, improving with hydration. 5. Hyperkalemia, resolved. 6. Hyponatremia, resolved with hydration.  7. Nausea and vomiting, resolved.   SECONDARY DIAGNOSES:  1. Hypertension.  2. Diabetes.  3. Dementia.  4. Hyperlipidemia. 5. Osteoporosis.  6. Depression.  7. Neuropathy. 8. Chronic kidney disease. 9. History of cerebrovascular accident.  10. Vitamin D deficiency. 11. Recurrent urinary tract infection.  12. Gastroesophageal reflux disease.   CONSULTATION: Physical therapy.   LABORATORY, DIAGNOSTIC AND RADIOLOGICAL DATA: Chest x-ray on 23rd of September showed no evidence of pneumonia.   Urinalysis on admission showed 881 WBCs, 3+ bacteria, 3+ leukocyte esterase, and WBC in clumps present.   Blood cultures x2 were negative on admission.   Urine culture grew more than 100,000 colonies of Escherichia coli which was pansensitive.   HISTORY AND SHORT HOSPITAL COURSE: Patient is a 69 year old female with above-mentioned medical problems was admitted for to nonketotic hyperglycemia. Please see Dr. Margaretmary EddyShah's dictated history and physical for further details. She was also found to have malignant hypertension which was resolved while in the hospital. She had acute renal failure which was also resolved with hydration and her urinary tract infection started improving and IV Rocephin. At some point in the hospital this was felt to be acute pyelonephritis. Her antibiotics were continued and based on culture sensitivity she was changed to oral Keflex as she did  very well with IV Rocephin. Her blood pressure has been very stable and her sugar has been fairly well controlled. She has been evaluated by physical therapy and recommended rehab where she is going today.  PHYSICAL EXAMINATION: VITAL SIGNS: On the date of discharge her vital signs are as follows: Temperature 98.9, heart rate 63 per minute, respirations 18 per minute, blood pressure 122/69 mmHg. She is saturating 97% on room air. Her last blood sugar was 182. Pertinent Physical Examination on the date of discharge: CARDIOVASCULAR: S1, S2 normal. No murmurs, rales, or gallop. LUNGS: Clear to auscultation bilaterally. No wheezing, rales, rhonchi, crepitation. ABDOMEN: Soft, benign. NEUROLOGIC: Nonfocal examination. All other physical examination remained at the baseline.   DISCHARGE MEDICATIONS:  1. Metformin 500 mg Lee.o. b.i.d.  2. Amlodipine 10 mg Lee.o. daily. 3. Aspirin 81 mg Lee.o. daily.  4. Metoprolol 25 mg Lee.o. b.i.d.  5. Crestor 10 mg Lee.o. at bedtime.  6. Sertraline 100 mg Lee.o. daily.  7. Insulin Lantus 45 units sub-Q at bedtime.  8. Keflex 250 mg Lee.o. 4 times a day for one more day.   DISCHARGE DIET: Low sodium, 1800 ADA.   DISCHARGE ACTIVITY: As tolerated.   DISCHARGE INSTRUCTIONS AND FOLLOW UP: Patient was instructed to follow up with her primary care physician at [facility], Dr. Fulton MoleHarriett Burns. She will get physical therapy evaluation and management while at the facility.   TOTAL TIME DISCHARGING THIS PATIENT: 55 minutes.   ____________________________ Ellamae SiaVipul S. Sherryll BurgerShah, MD vss:cms D: 12/23/2011 15:50:45 ET T: 12/23/2011 16:26:39 ET JOB#: [id number]  cc: Maliya Marich S. Sherryll BurgerShah, MD, <Dictator> Hyman HopesHarriett Lee. Burns, MD [facility] Ellamae SiaVIPUL S [facility] MD ELECTRONICALLY SIGNED 12/23/2011 22:42

## 2014-07-19 NOTE — Discharge Summary (Signed)
PATIENT NAME:  Adrienne Lee, Adrienne Lee MR#:  [id number] DATE OF BIRTH:  05/21/1945  DATE OF ADMISSION:  08/05/2012 DATE OF DISCHARGE:  08/07/2012  PRIMARY CARE PROVIDER: Myrene GalasHarriet Burns, MD  DISCHARGE DIAGNOSES: 1.  Acute on chronic diastolic congestive heart failure.  2.  Hyperkalemia.  3.  Coronary artery disease.  4.  Diabetes mellitus type 2.  5.  Hyperlipidemia.  6.  Hypertension.  DISCHARGE MEDICATIONS: 1.  Metformin 500 mg oral 2 times a day. 2.  Amlodipine 10 mg oral once a day.  3.  Crestor 10 mg oral once a day.  4.  Sertraline 100 mg oral once a day.  5.  Protonix 40 mg oral once a day.  6.  Aspirin 81 mg oral once a day.  7.  Remeron 15 mg 1/2 tablet oral once a day at bedtime.  8.  Lantus 25 units subcutaneous once a day at bedtime.  9.  Donepezil 10 mg oral once a day. 10.  Metoprolol 25 mg oral once a day.  11.  Lasix 20 mg oral once a day.   CONSULTANTS: Dr. Excell Seltzerooper of cardiology.   ADMITTING HISTORY AND PHYSICAL:  Please see detailed H and Lee dictated by Dr. Mordecai MaesSanchez. In brief, this is a 69 year old female patient with history of diastolic congestive heart failure, hypertension, hyperlipidemia, dementia presented to the hospital complaining of shortness of breath and edema along with orthopnea. The patient was found to be in acute congestive heart failure and was admitted to the hospitalist service.   HOSPITAL COURSE: 1. Acute on chronic diastolic congestive heart failure. The patient had an echocardiogram which showed ejection fraction of 55% to 60% with diastolic dysfunction. The patient responded well to IV diuresis with negative fluid output. By the day of discharge the patient is saturating 94% on room air at rest and on ambulation.  She has been started on oral Lasix which she was not taking as outpatient and is being discharged home. The patient was seen by cardiology who agreed with the plan.   On day of discharge, the patient has no crackles on exam and minimal edema.    DISCHARGE INSTRUCTIONS: The patient is being discharged home on diabetic, low-salt diet. Activity as tolerated. Follow up with primary care physician and her cardiologist in one week.   TIME SPENT: Time spent on day of discharge in discharge activity was 46 minutes.     ____________________________ Molinda BailiffSrikar R. Shaquan Missey, MD srs:ct D: 08/07/2012 15:20:21 ET T: 08/08/2012 07:53:24 ET JOB#: [id number]  cc: Wardell HeathSrikar R. May Ozment, MD, <Dictator> Hyman HopesHarriett Lee. Burns, MD Orie FishermanSRIKAR R Keturah Yerby MD ELECTRONICALLY SIGNED 08/09/2012 13:59
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
## 2014-07-19 NOTE — Consult Note (Signed)
General Aspect Reason for Consult: CHF   Present Illness 69 year-old woman admitted yesterday for heart failure. Apparently brought in from assited living facility for shortness of breath. Clinical exam, labs results, and radiographic data suggested heart failure. Asked to see her for further evaluation. The patient states today 'I don't know why I am here. I feel fine."   She has a history of CAD s/p CABG, but doesn't rememmber details...thinks surgery was a long time ago at [facility]. She denies shortness of breath, palpitations, orthopnea, PND, chest pain, chest pressure, lightheadedness, syncope, or leg swelling.   She states that she lives at home with her husband, so a little unsure of accuracy of her history. She knows she is at [facility] and knows her age, seems appropriate.  PMHx includes: HTN Type 2 DM CAD s/p CABG CKD Dementia TIA  FamHx is negative for premature CAD or heart failure  SocHx: married with 3 children, remote smoker, no Etoh. She has not worked outside of the home.   Physical Exam:  GEN well developed, well nourished, no acute distress   HEENT pink conjunctivae, PERRL, Oropharynx clear   NECK supple  No masses  thyroid not tender  trachea midline   RESP normal resp effort  clear BS   CARD Regular rate and rhythm  Normal, S1, S2  Murmur   Murmur Systolic  2/6   Systolic Murmur Out flow   ABD denies tenderness  denies Flank Tenderness  no liver/spleen enlargement  soft  normal BS  no Adominal Mass   LYMPH negative neck   EXTR negative cyanosis/clubbing, negative edema   SKIN normal to palpation, No rashes   NEURO cranial nerves intact, follows commands, motor/sensory function intact   PSYCH alert   Review of Systems:  General: No Complaints   Skin: No Complaints   ENT: No Complaints   Eyes: No Complaints   Neck: No Complaints   Respiratory: No Complaints   Cardiovascular: No Complaints   Gastrointestinal: No Complaints    Genitourinary: No Complaints   Vascular: No Complaints   Musculoskeletal: No Complaints   Neurologic: No Complaints   Hematologic: No Complaints   Endocrine: No Complaints   Psychiatric: No Complaints   Review of Systems: All other systems were reviewed and found to be negative   Medications/Allergies Reviewed Medications/Allergies reviewed    Levaquin: GI Distress  Augmentin: GI Distress  Codeine: Other  Penicillin: Other  Phenergan: Other  Morphine: None  Ace Inhibitors: Other  Bactrim: Other  Percocet 10/325: Unknown  Sulfa drugs: Unknown   Impression 1. Acute diastolic CHF 2. Type 2 DM 3. CAD, native vessel with history of CABG remotely 4. HTN   Plan I have reviewed the patient's echo images from this am. She has normal LV systolic function with estimated LVEF 60% and no significant valvular disease. Dr Mariah Milling will provide official interpretation in the am. Despite increased BNP, she appears clinically euvolemic at present. I think she has responded well to IV diuresis with furosemide. Reasonable to continue today, but she is probably fine to convert to oral furosemide in the am and discharge home tomorrow unless there is a clinical change. There is no evidence of active ischemia as she is without chest discomfort and enzymes are normal. Will order 12-lead EKG as I don't see one in the system. Otherwsie meds reviewed and I would continue amlodipine and metoprolol. She is unable to take ACE/ARB because of history of hyperkalemia.  Will arrange outpatient cardiology  follow-up for surveillance and management of her heart failure. Please call if we can be of any further assistance.   Electronic Signatures: Tonny Bollmanooper, Artesha Wemhoff (MD)  (Signed 11-May-14 12:09)  Authored: General Aspect/Present Illness, History and Physical Exam, Review of System, Allergies, Impression/Plan   Last Updated: 11-May-14 12:09 by Tonny Bollmanooper, Annaliesa Blann (MD)

## 2014-07-19 NOTE — H&P (Signed)
PATIENT NAME:  Adrienne Lee, Adrienne Lee MR#:  [id number] DATE OF BIRTH:  1945/04/16  DATE OF ADMISSION:  08/05/2012  REFERRING PHYSICIAN:  Dr. Dorothea Glassman.   CHIEF COMPLAINT:  Shortness of breath and edema.  As per patient she told the people from the assisted living facility she was not feeling good and her head hurts.  Right now she does not have any headache.   HISTORY OF PRESENT ILLNESS:  Adrienne Lee is a nice 69 year old female with history of depression, dementia, hypertension, hyperlipidemia, chronic kidney disease, neuropathy and a previous CVA who at this moment is telling me that she does not know why she is here.    The patient overall has dementia which has been well-controlled.  Apparently she is on assisted living facility.  She is telling me that she lives in her own house with her father and her husband.    I am not quite sure about this information and I do not have any source to corroborate that at this moment.   As per the history, the patient has been having significant cough and shortness of breath for several days.  She was brought to the ER via EMS.  She was diagnosed with congestive heart failure.  Her chest x-ray has increased vascular margins and mild congestion and her BNP is elevated above 1000 and she has edema of the lower extremities.  At this moment the patient is alert.  She is not on any significant distress, but is difficult to obtain a good information from her.  When she was brought up over here her respiratory rate was around 16.  Her pulse was 58.  Her blood pressure was normal and she was on room air with a 98% oxygen saturation.  Her potassium was elevated and apparently this has been happening before.   REVIEW OF SYSTEMS:  A 12 system review of systems is done.  The patient is able to give me some answers, but I am not quite sure if it is reliable.  CONSTITUTIONAL:  Denies any fever, fatigue, weight loss or weight gain, but she has significant edema and  apparently she has been gaining weight as per nursing home personnel checked to the EMS.  EYES:  No double vision or blurry vision.  EARS, NOSE, THROAT:  No tinnitus.  No difficulty swallowing.  RESPIRATORY:  The patient states she has occasional cough.  Denies any wheezing.  Denies any painful respiration.  Denies any significant shortness of breath with exertion or in general, although from chart she was really short of breath today.  CARDIOVASCULAR:  No chest pain, orthopnea.  Positive edemas.  No arrhythmias.  GASTROINTESTINAL:  No nausea, vomiting, abdominal pain, constipation or diarrhea.  GENITOURINARY:  No dysuria or hematuria.  ENDOCRINE:  No polyuria, polydipsia, polyphagia.  No cold or heat intolerance.  HEMATOLOGIC AND LYMPHATIC:  No anemia, easy bruising or swollen glands.  MUSCULOSKELETAL:  No significant back pain or leg pain.  No gout.  NEUROLOGIC:  No numbness, tingling.  Positive CVA in the past.  PSYCHIATRIC:  Negative anxiety or insomnia.  Positive dementia.   PAST MEDICAL HISTORY: 1.  Hypertension.  2.  Insulin-dependent diabetes.  3.  Dementia.  4.  Hyperlipidemia.  5.  Osteoporosis.  6.  Depression.  7.  Peripheral neuropathy.  8.  Chronic kidney disease.  9.  CVA.  10.  History of multiple urinary tract infections.  11.  GERD.   ALLERGIES:  THE PATIENT IS ALLERGIC TO ACE  HIBITORS, AUGMENTIN, BACTRIM, CODEINE, LEVAQUIN, MORPHINE, PENICILLIN, PHENERGAN AND SULFA.   SOCIAL HISTORY:  The patient is living in an assisted living facility now.  She does not smoke currently, but she used to smoke.  She quit 20 years ago.  She does not use alcohol.   FAMILY HISTORY:  The patient states that her father is still alive.  Denies any coronary artery disease, MIs in the past, diabetes or strokes.  No cancer in the family.    MEDICATIONS:  Sertraline 100 mg once daily, Percocet 5/325 4 times daily, Protonix 40 mg once daily, NovoLog insulin sliding scale, metoprolol 25 mg once  daily, metformin no longer taking actually.  Lantus 25 mg subcutaneously once at night.  Crestor 10 mg daily, aspirin 81 mg daily.  On previous records says that patient was taking Aricept, but she is no longer having as an active medication on the list of medications from nursing home.  Amlodipine 10 mg once a day.   PHYSICAL EXAMINATION: VITAL SIGNS:  Temperature 98.7, pulse 61, respirations 18, blood pressure as high as 212/64, right now 152/71, oxygen saturation 95% on room air.  GENERAL:  Alert, oriented in person and place, but not in time, slightly confused, but pleasantly.  HEENT:  Pupils are equal and reactive.  Extraocular movements are intact.  Mucosa are moist.  Anicteric sclerae.  Pink conjunctivae.  No oral lesions.  No oropharyngeal exudates.  NECK:  Supple.  No JVD.  No thyromegaly.  No adenopathy.  No thyroid bruits.  No rigidity.  CARDIOVASCULAR:  Regular rate and rhythm.  No S3 or S4.  No displacement of PMI.  LUNGS:  Positive crackles, diffuse throughout lower lungs, middle lung fields.  No use of accessory muscles at this moment.  No dullness to percussion.  ABDOMEN:  Soft, nontender, nondistended.  No hepatosplenomegaly.  No masses.  Bowel sounds are positive.  GENITAL:  Deferred.  EXTREMITIES:  Positive edema +1, pulses +2.  Capillary refill less than 3.  SKIN:  Without any rashes or petechiae.  NEUROLOGIC:  Cranial nerves II through XII intact.  The strength is equal in all four extremities.  Sensation is equal in four extremities.  LYMPHATICS:  Negative for lymphadenopathy in the neck or supraclavicular areas.  MUSCULOSKELETAL:  No significant joint effusions.  Range of motion seems to be appropriate.  PSYCHIATRIC:  Slightly confused pleasantly, but is alert, oriented x 2.   LABORATORY RESULTS:  EKG, normal sinus rhythm, right bundle branch block.  No ST depression or elevation.  Glucose 125, BNP 1100, BUN 25, creatinine 1.25, potassium is 5.9.  GFR is around 40.  LFTs  within normal limits.  White blood cells are 9.7, hemoglobin 11.7, platelets of 152.  White blood cells 3, leukocyte esterase negative, nitrites are negative.   ASSESSMENT AND PLAN:  A 69 year old female with history of hypertension, diabetes, previous cerebrovascular accident, depression and dementia, comes with history of shortness of breath and diagnosis of new congestive heart failure.  1.  New diagnosis congestive heart failure.  At this moment the patient is admitted for evaluation of this.  Her cardiac enzymes, troponin is negative.  I am going to cycle those.  I am going to get TSH.  Weight monitoring daily, congestive heart failure education, diet with low salt.  Cardiology consultation.  Echocardiogram to be obtained to determine if this is diastolic or systolic.  Lasix IV 20 mg every 12 hours.  2.  Hyperkalemia.  We are going to treat it  also with Lasix.  At this moment the patient has stable chronic kidney disease.  We are going to recommend the use of a diet without potassium as an outpatient.  For now we are going to treat it with Lasix.  The patient is not on an ACE INHIBITOR because of previous hyperkalemia and she has on her allergies a list of ACE INHIBITORS AN ALLERGY. 3.  Dementia, seems to be stable with patient has been taking out of Aricept at this moment.  I am not going to start on any new medications.  4.  Diabetes type 2, insulin-dependent diabetes.  Continue same medication with Lantus 25 units at bedtime.  Her blood sugar right now is 125.  Monitor closely.  Insulin sliding scale ordered.  Hemoglobin A1c ordered.  5.  Dyslipidemia.  The patient has a history of elevated cholesterol.  We are going to recheck her lipid profile today.   6.  The patient is apparently a FULL CODE.  7.  Gastrointestinal prophylaxis with PPI and deep vein thrombosis prophylaxis with heparin.   TIME SPENT:  I spent about 45 minutes with this patient.     ____________________________ Felipa Furnace, MD rsg:ea D: 08/05/2012 20:00:26 ET T: 08/06/2012 02:36:27 ET JOB#: [id number]  cc: Felipa Furnace, MD, <Dictator> Tyeasha Ebbs Juanda Chance MD ELECTRONICALLY SIGNED 08/07/2012 12:10
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
## 2014-07-21 NOTE — Consult Note (Signed)
PATIENT NAME:  Adrienne Lee, Adrienne Lee MR#:  [id number] DATE OF BIRTH:  08-May-1945  DATE OF CONSULTATION:  02/17/2011  REFERRING PHYSICIAN:  Fulton Mole, MD CONSULTING PHYSICIAN:  Cindra Austad D. Caid Radin, MD  INDICATION: Syncope with coronary artery disease.  HISTORY OF PRESENT ILLNESS: Ms. Bordas is a 69 year old white female with history of multiple medical problems including coronary artery disease with coronary bypass surgery, myocardial infarction, diabetes, hypertension, hyperlipidemia, depression, obesity, and early dementia who has a history of bradycardia due to beta blockers in the past who presented with a syncopal episode. She was on the back porch which is concrete reportedly. She was brought back in the house and passed out. She felt weakness come on and fell backward. She was slightly dizzy, but otherwise she was feeling okay and suddenly blacked out for a few seconds. She called a companion who called EMS and brought here after she was complaining of some headache and dizziness, but now she is doing okay. She has no shortness of breath or chest pain. She denies palpitations. No nausea or vomiting. She forgot to take her medications earlier that day. Her blood pressure was high around 200/100. She was last admitted about a year ago. No recent blackout spells. No chest pain or angina. Cardiac consultation with the family was recommended.   REVIEW OF SYSTEMS: Mild syncope. No blackout spells. No nausea or vomiting. No fever. No chills. No sweats. No weight loss. No weight gain. No hemoptysis or hematemesis or bright red blood per rectum. She has had vertigo and dizziness, elevated blood pressure, weakness, and decreased memory. She denies significant trauma.   PAST MEDICAL HISTORY:  1. Coronary artery disease. 2. Diabetes. 3. Hypertension. 4. Hyperlipidemia. 5. Depression. 6. Bradycardia. 7. Gastroesophageal reflux disease.  8. Obesity. 9. Urinary tract infection. 10. Early dementia.   PAST  SURGICAL HISTORY:  1. Cholecystectomy.  2. Tonsillectomy.  3. Cesarean section. 4. Coronary artery bypass surgery.   ALLERGIES: ACE inhibitors, Augmentin, Bactrim, codeine, Levaquin, morphine, penicillin, Phenergan, and sulfa.   MEDICATIONS:  1. Alendronate 70 mg a week.  2. Aspirin 81 mg a day.  3. Citalopram 40 mg daily.  4. Crestor 10 mg at bedtime. 5. Lantus 20 units at bedtime. 6. Lisinopril 10 mg once daily. 7. Metformin 1000 mg twice a day.  8. Metoprolol 25 mg twice a day.  9. Norvasc 10 mg a day.  10. Prilosec 20 mg a day. 11. Sertraline 100 mg at bedtime.  12. Vitamin D-3 800 units daily.   FAMILY HISTORY: Heart disease.   SOCIAL HISTORY: She is a widow and lives with her son. No recent smoking or alcohol consumption.   PHYSICAL EXAMINATION:   VITAL SIGNS: Blood pressure at the time was 190/80, pulse 75, respiratory rate 16, and afebrile.   HEENT: Normocephalic, atraumatic. Pupils equal and reactive to light.   NECK: Supple. No jugular venous distention, bruits, or adenopathy.   LUNGS: Clear to auscultation and percussion. No significant wheezing, rhonchi, or rales.  HEART: Regular rate and rhythm. Positive S4. PMI is nondisplaced. Systolic ejection murmur at the apex.   ABDOMEN: Examination is benign.   EXTREMITY: Examination is within normal limits.   NEUROLOGIC: Examination is grossly intact.  SKIN: Examination is normal.   LABS/STUDIES: White count 9.3, hemoglobin 13, and platelet count 215. Sodium 141, potassium 5.6, BUN 25, and creatinine 1.13. Troponin was negative. Drug screen was negative.   Urinalysis was negative.   CT of the head was basically unremarkable.   Electrocardiogram  showed normal sinus rhythm, IVCD deviation, right bundle branch block, bifascicular block, nonspecific changes, unchanged from previous.   ASSESSMENT:  1. Syncope.  2. Malignant hypertension.  3. Coronary artery  disease. 4. Diabetes. 5. Hypertension. 6. Hyperlipidemia. 7. Hyperkalemia.  8. Obesity.  9. Gastroesophageal reflux disease.  10. Possible history of obstructive sleep apnea.  11. Possible early dementia.   PLAN: Agree with admit and rule out for myocardial infarction. Follow up cardiac enzymes. Place on telemetry. Watch for bradycardia or tachycardia related to sick sinus syndrome. Control blood pressure. May need to increase Norvasc or lisinopril. Consider adding an ARB. Continue diabetes control. Consider echocardiogram. I would hold off on functional study for now. Continue lipid management. Recommend weight loss and exercise. In the past she had been noted to have early dementia. Consider whether the patient needs gait training or orthostatic evaluation. There is no clear evidence of dehydration. Her potassium is elevated and will probably need to have that corrected. She may need to have her ACE inhibitor reduced or checked. Consider psych evaluation or neurology evaluation for early dementia. No clear evidence of seizure. I would continue to treat the patient medically for now.  ____________________________ Bobbie Stackwayne D. Juliann Paresallwood, MD ddc:slb D: 02/23/2011 00:09:00 ET T: 02/23/2011 11:37:08 ET JOB#: [id number]  cc: Lorimer Tiberio D. Juliann Paresallwood, MD, <Dictator> Alwyn PeaWAYNE D Letasha Kershaw MD ELECTRONICALLY SIGNED 04/02/2011 13:59

## 2014-07-21 NOTE — Op Note (Signed)
PATIENT NAME:  Adrienne Lee, Adrienne Lee MR#:  [id number] DATE OF BIRTH:  1945/08/05  DATE OF PROCEDURE:  03/29/2011  PREOPERATIVE DIAGNOSIS: Cataract, right eye.   POSTOPERATIVE DIAGNOSIS: Cataract, right eye.   PROCEDURE PERFORMED: Extracapsular cataract extraction using phacoemulsification with placement of an Alcon  [id number], 17.5-diopter posterior chamber lens, serial # [id number].   SURGEON: Maylon PeppersSteven A. Astaria Nanez, M.D.   ASSISTANT: None.   ANESTHESIA: 4% lidocaine and 0.75% Marcaine in a 50-50 mixture with 10 units/mL of Vitrase added, given as a peribulbar.   ANESTHESIOLOGIST: Dr. Pernell DupreAdams   COMPLICATIONS: None.   ESTIMATED BLOOD LOSS: Less than 1 mL.    DESCRIPTION OF PROCEDURE:  The patient was brought to the operating room and given a peribulbar block.  The patient was then prepped and draped in the usual fashion.  The vertical rectus muscles were imbricated using 5-0 silk sutures.  These sutures were then clamped to the sterile drapes as bridle sutures.  A limbal peritomy was performed extending two clock hours and hemostasis was obtained with cautery.  A partial thickness scleral groove was made at the surgical limbus and dissected anteriorly in a lamellar dissection using an Alcon crescent knife.  The anterior chamber was entered superonasally with a Superblade and through the lamellar dissection with a 2.6 mm keratome.  DisCoVisc was used to replace the aqueous and a continuous tear capsulorrhexis was carried out.  Hydrodissection and hydrodelineation were carried out with balanced salt and a 27 gauge canula.  The nucleus was rotated to confirm the effectiveness of the hydrodissection.  Phacoemulsification was carried out using a divide-and-conquer technique.  Total ultrasound time was 2 minutes and 45 seconds with an average power of 24.2 percent.  Irrigation/aspiration was used to remove the residual cortex.  DisCoVisc was used to inflate the capsule and the internal incision was enlarged  to 3 mm with the crescent knife.  The intraocular lens was folded and inserted into the capsular bag using the Acrysert delivery system. Irrigation/aspiration was used to remove the residual DisCoVisc.  Miostat was injected into the anterior chamber through the paracentesis track to inflate the anterior chamber and induce miosis.  The wound was checked for leaks and none were found. The conjunctiva was closed with cautery and the bridle sutures were removed.  Two drops of 0.3% Vigamox were placed on the eye.   An eye shield was placed on the eye.  The patient was discharged to the recovery room in good condition.  ____________________________ Maylon PeppersSteven A. Jerlean Peralta, MD sad:bjt D: 03/29/2011 13:21:44 ET T: 03/29/2011 14:42:50 ET JOB#: [id number]  cc: Viviann SpareSteven A. Rebbeca Sheperd, MD, <Dictator> Erline LevineSTEVEN A Analysia Dungee MD ELECTRONICALLY SIGNED 04/05/2011 12:48
